# Patient Record
Sex: Female | Born: 1950
Health system: Southern US, Community
[De-identification: ages and names within clinical notes are randomized; demographics above are authoritative.]

## PROBLEM LIST (undated history)

## (undated) DIAGNOSIS — I2602 Saddle embolus of pulmonary artery with acute cor pulmonale: Secondary | ICD-10-CM

## (undated) DIAGNOSIS — M349 Systemic sclerosis, unspecified: Secondary | ICD-10-CM

## (undated) DIAGNOSIS — E039 Hypothyroidism, unspecified: Secondary | ICD-10-CM

## (undated) DIAGNOSIS — N1831 Chronic kidney disease, stage 3a: Secondary | ICD-10-CM

## (undated) DIAGNOSIS — E7800 Pure hypercholesterolemia, unspecified: Secondary | ICD-10-CM

## (undated) DIAGNOSIS — M797 Fibromyalgia: Secondary | ICD-10-CM

## (undated) DIAGNOSIS — M3500 Sicca syndrome, unspecified: Secondary | ICD-10-CM

## (undated) DIAGNOSIS — I10 Essential (primary) hypertension: Secondary | ICD-10-CM

## (undated) DIAGNOSIS — M1990 Unspecified osteoarthritis, unspecified site: Secondary | ICD-10-CM

## (undated) DIAGNOSIS — F3181 Bipolar II disorder: Secondary | ICD-10-CM

## (undated) DIAGNOSIS — R251 Tremor, unspecified: Secondary | ICD-10-CM

## (undated) HISTORY — PX: KNEE SURGERY: SHX244

## (undated) HISTORY — DX: Sjogren syndrome, unspecified: M35.00

## (undated) HISTORY — DX: Hypothyroidism, unspecified: E03.9

## (undated) HISTORY — DX: Essential (primary) hypertension: I10

## (undated) HISTORY — PX: TONSILLECTOMY: SUR1361

## (undated) HISTORY — DX: Saddle embolus of pulmonary artery with acute cor pulmonale: I26.02

## (undated) HISTORY — DX: Tremor, unspecified: R25.1

## (undated) HISTORY — DX: Bipolar II disorder: F31.81

---

## 2012-05-14 ENCOUNTER — Other Ambulatory Visit: Payer: Self-pay | Admitting: Family Medicine

## 2012-05-14 DIAGNOSIS — R944 Abnormal results of kidney function studies: Secondary | ICD-10-CM

## 2012-05-15 ENCOUNTER — Ambulatory Visit
Admission: RE | Admit: 2012-05-15 | Discharge: 2012-05-15 | Disposition: A | Discharge status: Home / Self Care | Payer: Self-pay | Source: Ambulatory Visit | Attending: Family Medicine | Admitting: Family Medicine

## 2012-05-15 DIAGNOSIS — R944 Abnormal results of kidney function studies: Secondary | ICD-10-CM

## 2015-10-18 DIAGNOSIS — F329 Major depressive disorder, single episode, unspecified: Secondary | ICD-10-CM | POA: Diagnosis not present

## 2015-10-18 DIAGNOSIS — M791 Myalgia: Secondary | ICD-10-CM | POA: Diagnosis not present

## 2015-10-18 DIAGNOSIS — Z23 Encounter for immunization: Secondary | ICD-10-CM | POA: Diagnosis not present

## 2015-10-18 DIAGNOSIS — G479 Sleep disorder, unspecified: Secondary | ICD-10-CM | POA: Diagnosis not present

## 2015-10-18 DIAGNOSIS — E039 Hypothyroidism, unspecified: Secondary | ICD-10-CM | POA: Diagnosis not present

## 2015-10-18 DIAGNOSIS — R232 Flushing: Secondary | ICD-10-CM | POA: Diagnosis not present

## 2015-10-18 DIAGNOSIS — E7800 Pure hypercholesterolemia, unspecified: Secondary | ICD-10-CM | POA: Diagnosis not present

## 2015-10-18 DIAGNOSIS — I10 Essential (primary) hypertension: Secondary | ICD-10-CM | POA: Diagnosis not present

## 2016-04-18 DIAGNOSIS — K219 Gastro-esophageal reflux disease without esophagitis: Secondary | ICD-10-CM | POA: Diagnosis not present

## 2016-04-18 DIAGNOSIS — Z6830 Body mass index (BMI) 30.0-30.9, adult: Secondary | ICD-10-CM | POA: Diagnosis not present

## 2016-04-18 DIAGNOSIS — I10 Essential (primary) hypertension: Secondary | ICD-10-CM | POA: Diagnosis not present

## 2016-04-18 DIAGNOSIS — E039 Hypothyroidism, unspecified: Secondary | ICD-10-CM | POA: Diagnosis not present

## 2016-04-18 DIAGNOSIS — N183 Chronic kidney disease, stage 3 (moderate): Secondary | ICD-10-CM | POA: Diagnosis not present

## 2016-04-18 DIAGNOSIS — E7800 Pure hypercholesterolemia, unspecified: Secondary | ICD-10-CM | POA: Diagnosis not present

## 2016-04-18 DIAGNOSIS — G479 Sleep disorder, unspecified: Secondary | ICD-10-CM | POA: Diagnosis not present

## 2016-04-18 DIAGNOSIS — F329 Major depressive disorder, single episode, unspecified: Secondary | ICD-10-CM | POA: Diagnosis not present

## 2016-04-18 DIAGNOSIS — E6609 Other obesity due to excess calories: Secondary | ICD-10-CM | POA: Diagnosis not present

## 2016-04-18 DIAGNOSIS — M791 Myalgia: Secondary | ICD-10-CM | POA: Diagnosis not present

## 2016-08-20 DIAGNOSIS — L309 Dermatitis, unspecified: Secondary | ICD-10-CM | POA: Diagnosis not present

## 2016-10-18 DIAGNOSIS — E7800 Pure hypercholesterolemia, unspecified: Secondary | ICD-10-CM | POA: Diagnosis not present

## 2016-10-18 DIAGNOSIS — E039 Hypothyroidism, unspecified: Secondary | ICD-10-CM | POA: Diagnosis not present

## 2016-10-18 DIAGNOSIS — Z1159 Encounter for screening for other viral diseases: Secondary | ICD-10-CM | POA: Diagnosis not present

## 2016-10-18 DIAGNOSIS — G479 Sleep disorder, unspecified: Secondary | ICD-10-CM | POA: Diagnosis not present

## 2016-10-18 DIAGNOSIS — F329 Major depressive disorder, single episode, unspecified: Secondary | ICD-10-CM | POA: Diagnosis not present

## 2016-10-18 DIAGNOSIS — M791 Myalgia: Secondary | ICD-10-CM | POA: Diagnosis not present

## 2016-10-18 DIAGNOSIS — N183 Chronic kidney disease, stage 3 (moderate): Secondary | ICD-10-CM | POA: Diagnosis not present

## 2016-10-18 DIAGNOSIS — I10 Essential (primary) hypertension: Secondary | ICD-10-CM | POA: Diagnosis not present

## 2017-04-08 DIAGNOSIS — E039 Hypothyroidism, unspecified: Secondary | ICD-10-CM | POA: Diagnosis not present

## 2017-04-08 DIAGNOSIS — I10 Essential (primary) hypertension: Secondary | ICD-10-CM | POA: Diagnosis not present

## 2017-04-08 DIAGNOSIS — G479 Sleep disorder, unspecified: Secondary | ICD-10-CM | POA: Diagnosis not present

## 2017-04-08 DIAGNOSIS — E6609 Other obesity due to excess calories: Secondary | ICD-10-CM | POA: Diagnosis not present

## 2017-04-08 DIAGNOSIS — M791 Myalgia: Secondary | ICD-10-CM | POA: Diagnosis not present

## 2017-04-08 DIAGNOSIS — E7800 Pure hypercholesterolemia, unspecified: Secondary | ICD-10-CM | POA: Diagnosis not present

## 2017-04-08 DIAGNOSIS — N183 Chronic kidney disease, stage 3 (moderate): Secondary | ICD-10-CM | POA: Diagnosis not present

## 2017-04-08 DIAGNOSIS — F329 Major depressive disorder, single episode, unspecified: Secondary | ICD-10-CM | POA: Diagnosis not present

## 2017-11-25 DIAGNOSIS — E7800 Pure hypercholesterolemia, unspecified: Secondary | ICD-10-CM | POA: Diagnosis not present

## 2017-11-25 DIAGNOSIS — R234 Changes in skin texture: Secondary | ICD-10-CM | POA: Diagnosis not present

## 2017-11-25 DIAGNOSIS — E6609 Other obesity due to excess calories: Secondary | ICD-10-CM | POA: Diagnosis not present

## 2017-11-25 DIAGNOSIS — E039 Hypothyroidism, unspecified: Secondary | ICD-10-CM | POA: Diagnosis not present

## 2017-11-25 DIAGNOSIS — H6121 Impacted cerumen, right ear: Secondary | ICD-10-CM | POA: Diagnosis not present

## 2017-11-25 DIAGNOSIS — Z8349 Family history of other endocrine, nutritional and metabolic diseases: Secondary | ICD-10-CM | POA: Diagnosis not present

## 2017-11-25 DIAGNOSIS — M791 Myalgia, unspecified site: Secondary | ICD-10-CM | POA: Diagnosis not present

## 2017-11-25 DIAGNOSIS — I10 Essential (primary) hypertension: Secondary | ICD-10-CM | POA: Diagnosis not present

## 2017-11-25 DIAGNOSIS — F329 Major depressive disorder, single episode, unspecified: Secondary | ICD-10-CM | POA: Diagnosis not present

## 2017-11-25 DIAGNOSIS — G479 Sleep disorder, unspecified: Secondary | ICD-10-CM | POA: Diagnosis not present

## 2018-06-02 DIAGNOSIS — F3340 Major depressive disorder, recurrent, in remission, unspecified: Secondary | ICD-10-CM | POA: Diagnosis not present

## 2018-06-02 DIAGNOSIS — E7800 Pure hypercholesterolemia, unspecified: Secondary | ICD-10-CM | POA: Diagnosis not present

## 2018-06-02 DIAGNOSIS — I129 Hypertensive chronic kidney disease with stage 1 through stage 4 chronic kidney disease, or unspecified chronic kidney disease: Secondary | ICD-10-CM | POA: Diagnosis not present

## 2018-06-02 DIAGNOSIS — E039 Hypothyroidism, unspecified: Secondary | ICD-10-CM | POA: Diagnosis not present

## 2018-06-02 DIAGNOSIS — Z23 Encounter for immunization: Secondary | ICD-10-CM | POA: Diagnosis not present

## 2018-06-02 DIAGNOSIS — N183 Chronic kidney disease, stage 3 (moderate): Secondary | ICD-10-CM | POA: Diagnosis not present

## 2018-08-26 DIAGNOSIS — H16223 Keratoconjunctivitis sicca, not specified as Sjogren's, bilateral: Secondary | ICD-10-CM | POA: Diagnosis not present

## 2018-09-23 DIAGNOSIS — L940 Localized scleroderma [morphea]: Secondary | ICD-10-CM | POA: Diagnosis not present

## 2018-10-13 DIAGNOSIS — L940 Localized scleroderma [morphea]: Secondary | ICD-10-CM | POA: Diagnosis not present

## 2018-10-23 DIAGNOSIS — M3501 Sicca syndrome with keratoconjunctivitis: Secondary | ICD-10-CM | POA: Diagnosis not present

## 2018-10-27 DIAGNOSIS — Z4802 Encounter for removal of sutures: Secondary | ICD-10-CM | POA: Diagnosis not present

## 2018-12-08 DIAGNOSIS — E039 Hypothyroidism, unspecified: Secondary | ICD-10-CM | POA: Diagnosis not present

## 2018-12-08 DIAGNOSIS — Z6829 Body mass index (BMI) 29.0-29.9, adult: Secondary | ICD-10-CM | POA: Diagnosis not present

## 2018-12-08 DIAGNOSIS — M791 Myalgia, unspecified site: Secondary | ICD-10-CM | POA: Diagnosis not present

## 2018-12-08 DIAGNOSIS — G479 Sleep disorder, unspecified: Secondary | ICD-10-CM | POA: Diagnosis not present

## 2018-12-08 DIAGNOSIS — I129 Hypertensive chronic kidney disease with stage 1 through stage 4 chronic kidney disease, or unspecified chronic kidney disease: Secondary | ICD-10-CM | POA: Diagnosis not present

## 2018-12-08 DIAGNOSIS — F331 Major depressive disorder, recurrent, moderate: Secondary | ICD-10-CM | POA: Diagnosis not present

## 2018-12-08 DIAGNOSIS — E7800 Pure hypercholesterolemia, unspecified: Secondary | ICD-10-CM | POA: Diagnosis not present

## 2018-12-08 DIAGNOSIS — L940 Localized scleroderma [morphea]: Secondary | ICD-10-CM | POA: Diagnosis not present

## 2018-12-18 DIAGNOSIS — H16223 Keratoconjunctivitis sicca, not specified as Sjogren's, bilateral: Secondary | ICD-10-CM | POA: Diagnosis not present

## 2018-12-25 DIAGNOSIS — M3501 Sicca syndrome with keratoconjunctivitis: Secondary | ICD-10-CM | POA: Diagnosis not present

## 2019-03-11 DIAGNOSIS — E039 Hypothyroidism, unspecified: Secondary | ICD-10-CM | POA: Diagnosis not present

## 2019-06-12 DIAGNOSIS — E039 Hypothyroidism, unspecified: Secondary | ICD-10-CM | POA: Diagnosis not present

## 2019-06-24 DIAGNOSIS — E039 Hypothyroidism, unspecified: Secondary | ICD-10-CM | POA: Diagnosis not present

## 2019-06-24 DIAGNOSIS — F3340 Major depressive disorder, recurrent, in remission, unspecified: Secondary | ICD-10-CM | POA: Diagnosis not present

## 2019-06-24 DIAGNOSIS — M791 Myalgia, unspecified site: Secondary | ICD-10-CM | POA: Diagnosis not present

## 2019-06-24 DIAGNOSIS — I129 Hypertensive chronic kidney disease with stage 1 through stage 4 chronic kidney disease, or unspecified chronic kidney disease: Secondary | ICD-10-CM | POA: Diagnosis not present

## 2019-06-24 DIAGNOSIS — N183 Chronic kidney disease, stage 3 (moderate): Secondary | ICD-10-CM | POA: Diagnosis not present

## 2019-06-24 DIAGNOSIS — R5383 Other fatigue: Secondary | ICD-10-CM | POA: Diagnosis not present

## 2019-08-05 DIAGNOSIS — N183 Chronic kidney disease, stage 3 (moderate): Secondary | ICD-10-CM | POA: Diagnosis not present

## 2019-08-05 DIAGNOSIS — F331 Major depressive disorder, recurrent, moderate: Secondary | ICD-10-CM | POA: Diagnosis not present

## 2019-08-05 DIAGNOSIS — E7800 Pure hypercholesterolemia, unspecified: Secondary | ICD-10-CM | POA: Diagnosis not present

## 2019-08-05 DIAGNOSIS — I129 Hypertensive chronic kidney disease with stage 1 through stage 4 chronic kidney disease, or unspecified chronic kidney disease: Secondary | ICD-10-CM | POA: Diagnosis not present

## 2019-08-05 DIAGNOSIS — E039 Hypothyroidism, unspecified: Secondary | ICD-10-CM | POA: Diagnosis not present

## 2019-08-05 DIAGNOSIS — F3340 Major depressive disorder, recurrent, in remission, unspecified: Secondary | ICD-10-CM | POA: Diagnosis not present

## 2019-09-07 DIAGNOSIS — H16223 Keratoconjunctivitis sicca, not specified as Sjogren's, bilateral: Secondary | ICD-10-CM | POA: Diagnosis not present

## 2019-11-09 DIAGNOSIS — I129 Hypertensive chronic kidney disease with stage 1 through stage 4 chronic kidney disease, or unspecified chronic kidney disease: Secondary | ICD-10-CM | POA: Diagnosis not present

## 2019-11-09 DIAGNOSIS — E039 Hypothyroidism, unspecified: Secondary | ICD-10-CM | POA: Diagnosis not present

## 2019-11-09 DIAGNOSIS — E7800 Pure hypercholesterolemia, unspecified: Secondary | ICD-10-CM | POA: Diagnosis not present

## 2019-11-09 DIAGNOSIS — F331 Major depressive disorder, recurrent, moderate: Secondary | ICD-10-CM | POA: Diagnosis not present

## 2019-11-09 DIAGNOSIS — F3340 Major depressive disorder, recurrent, in remission, unspecified: Secondary | ICD-10-CM | POA: Diagnosis not present

## 2020-01-19 DIAGNOSIS — Z23 Encounter for immunization: Secondary | ICD-10-CM | POA: Diagnosis not present

## 2020-02-16 DIAGNOSIS — Z23 Encounter for immunization: Secondary | ICD-10-CM | POA: Diagnosis not present

## 2020-02-22 DIAGNOSIS — E039 Hypothyroidism, unspecified: Secondary | ICD-10-CM | POA: Diagnosis not present

## 2020-02-22 DIAGNOSIS — I129 Hypertensive chronic kidney disease with stage 1 through stage 4 chronic kidney disease, or unspecified chronic kidney disease: Secondary | ICD-10-CM | POA: Diagnosis not present

## 2020-02-22 DIAGNOSIS — E7800 Pure hypercholesterolemia, unspecified: Secondary | ICD-10-CM | POA: Diagnosis not present

## 2020-02-22 DIAGNOSIS — R42 Dizziness and giddiness: Secondary | ICD-10-CM | POA: Diagnosis not present

## 2020-02-22 DIAGNOSIS — N183 Chronic kidney disease, stage 3 unspecified: Secondary | ICD-10-CM | POA: Diagnosis not present

## 2020-02-26 DIAGNOSIS — I129 Hypertensive chronic kidney disease with stage 1 through stage 4 chronic kidney disease, or unspecified chronic kidney disease: Secondary | ICD-10-CM | POA: Diagnosis not present

## 2020-02-26 DIAGNOSIS — N183 Chronic kidney disease, stage 3 unspecified: Secondary | ICD-10-CM | POA: Diagnosis not present

## 2020-02-26 DIAGNOSIS — E039 Hypothyroidism, unspecified: Secondary | ICD-10-CM | POA: Diagnosis not present

## 2020-02-26 DIAGNOSIS — E7800 Pure hypercholesterolemia, unspecified: Secondary | ICD-10-CM | POA: Diagnosis not present

## 2020-02-26 DIAGNOSIS — R42 Dizziness and giddiness: Secondary | ICD-10-CM | POA: Diagnosis not present

## 2020-03-24 DIAGNOSIS — F3340 Major depressive disorder, recurrent, in remission, unspecified: Secondary | ICD-10-CM | POA: Diagnosis not present

## 2020-03-24 DIAGNOSIS — E7800 Pure hypercholesterolemia, unspecified: Secondary | ICD-10-CM | POA: Diagnosis not present

## 2020-03-24 DIAGNOSIS — I129 Hypertensive chronic kidney disease with stage 1 through stage 4 chronic kidney disease, or unspecified chronic kidney disease: Secondary | ICD-10-CM | POA: Diagnosis not present

## 2020-03-24 DIAGNOSIS — F331 Major depressive disorder, recurrent, moderate: Secondary | ICD-10-CM | POA: Diagnosis not present

## 2020-03-24 DIAGNOSIS — N183 Chronic kidney disease, stage 3 unspecified: Secondary | ICD-10-CM | POA: Diagnosis not present

## 2020-03-24 DIAGNOSIS — E039 Hypothyroidism, unspecified: Secondary | ICD-10-CM | POA: Diagnosis not present

## 2020-04-08 DIAGNOSIS — N183 Chronic kidney disease, stage 3 unspecified: Secondary | ICD-10-CM | POA: Diagnosis not present

## 2020-04-08 DIAGNOSIS — I10 Essential (primary) hypertension: Secondary | ICD-10-CM | POA: Diagnosis not present

## 2020-04-08 DIAGNOSIS — F331 Major depressive disorder, recurrent, moderate: Secondary | ICD-10-CM | POA: Diagnosis not present

## 2020-04-08 DIAGNOSIS — E039 Hypothyroidism, unspecified: Secondary | ICD-10-CM | POA: Diagnosis not present

## 2020-04-08 DIAGNOSIS — F3181 Bipolar II disorder: Secondary | ICD-10-CM | POA: Diagnosis not present

## 2020-04-08 DIAGNOSIS — E7800 Pure hypercholesterolemia, unspecified: Secondary | ICD-10-CM | POA: Diagnosis not present

## 2020-04-08 DIAGNOSIS — I129 Hypertensive chronic kidney disease with stage 1 through stage 4 chronic kidney disease, or unspecified chronic kidney disease: Secondary | ICD-10-CM | POA: Diagnosis not present

## 2020-04-08 DIAGNOSIS — F3340 Major depressive disorder, recurrent, in remission, unspecified: Secondary | ICD-10-CM | POA: Diagnosis not present

## 2020-04-13 DIAGNOSIS — E039 Hypothyroidism, unspecified: Secondary | ICD-10-CM | POA: Diagnosis not present

## 2020-04-13 DIAGNOSIS — F3181 Bipolar II disorder: Secondary | ICD-10-CM | POA: Diagnosis not present

## 2020-04-13 DIAGNOSIS — I10 Essential (primary) hypertension: Secondary | ICD-10-CM | POA: Diagnosis not present

## 2020-05-23 DIAGNOSIS — R1110 Vomiting, unspecified: Secondary | ICD-10-CM | POA: Diagnosis not present

## 2020-05-23 DIAGNOSIS — I129 Hypertensive chronic kidney disease with stage 1 through stage 4 chronic kidney disease, or unspecified chronic kidney disease: Secondary | ICD-10-CM | POA: Diagnosis not present

## 2020-05-23 DIAGNOSIS — Z6827 Body mass index (BMI) 27.0-27.9, adult: Secondary | ICD-10-CM | POA: Diagnosis not present

## 2020-06-16 DIAGNOSIS — Z6827 Body mass index (BMI) 27.0-27.9, adult: Secondary | ICD-10-CM | POA: Diagnosis not present

## 2020-06-16 DIAGNOSIS — R1110 Vomiting, unspecified: Secondary | ICD-10-CM | POA: Diagnosis not present

## 2020-06-16 DIAGNOSIS — I129 Hypertensive chronic kidney disease with stage 1 through stage 4 chronic kidney disease, or unspecified chronic kidney disease: Secondary | ICD-10-CM | POA: Diagnosis not present

## 2020-06-16 DIAGNOSIS — R8290 Unspecified abnormal findings in urine: Secondary | ICD-10-CM | POA: Diagnosis not present

## 2020-06-22 ENCOUNTER — Other Ambulatory Visit: Payer: Self-pay | Admitting: Family Medicine

## 2020-06-22 DIAGNOSIS — N399 Disorder of urinary system, unspecified: Secondary | ICD-10-CM

## 2020-06-27 ENCOUNTER — Ambulatory Visit
Admission: RE | Admit: 2020-06-27 | Discharge: 2020-06-27 | Disposition: A | Discharge status: Home / Self Care | Payer: Medicare Other | Source: Ambulatory Visit | Attending: Family Medicine | Admitting: Family Medicine

## 2020-06-27 DIAGNOSIS — N390 Urinary tract infection, site not specified: Secondary | ICD-10-CM | POA: Diagnosis not present

## 2020-06-27 DIAGNOSIS — N399 Disorder of urinary system, unspecified: Secondary | ICD-10-CM

## 2020-09-19 DIAGNOSIS — M1612 Unilateral primary osteoarthritis, left hip: Secondary | ICD-10-CM | POA: Diagnosis not present

## 2020-09-19 DIAGNOSIS — M25562 Pain in left knee: Secondary | ICD-10-CM | POA: Diagnosis not present

## 2020-09-19 DIAGNOSIS — M25552 Pain in left hip: Secondary | ICD-10-CM | POA: Diagnosis not present

## 2020-10-17 DIAGNOSIS — M797 Fibromyalgia: Secondary | ICD-10-CM | POA: Diagnosis not present

## 2020-10-17 DIAGNOSIS — N183 Chronic kidney disease, stage 3 unspecified: Secondary | ICD-10-CM | POA: Diagnosis not present

## 2020-10-17 DIAGNOSIS — I129 Hypertensive chronic kidney disease with stage 1 through stage 4 chronic kidney disease, or unspecified chronic kidney disease: Secondary | ICD-10-CM | POA: Diagnosis not present

## 2020-10-17 DIAGNOSIS — E7800 Pure hypercholesterolemia, unspecified: Secondary | ICD-10-CM | POA: Diagnosis not present

## 2020-10-17 DIAGNOSIS — R42 Dizziness and giddiness: Secondary | ICD-10-CM | POA: Diagnosis not present

## 2020-10-17 DIAGNOSIS — E039 Hypothyroidism, unspecified: Secondary | ICD-10-CM | POA: Diagnosis not present

## 2020-10-17 DIAGNOSIS — Z6828 Body mass index (BMI) 28.0-28.9, adult: Secondary | ICD-10-CM | POA: Diagnosis not present

## 2020-10-21 DIAGNOSIS — M1612 Unilateral primary osteoarthritis, left hip: Secondary | ICD-10-CM | POA: Diagnosis not present

## 2020-10-21 DIAGNOSIS — M1712 Unilateral primary osteoarthritis, left knee: Secondary | ICD-10-CM | POA: Diagnosis not present

## 2021-03-09 ENCOUNTER — Inpatient Hospital Stay (HOSPITAL_COMMUNITY)
Admission: EM | Admit: 2021-03-09 | Discharge: 2021-03-13 | DRG: 175 | Disposition: A | Discharge status: Home / Self Care | Payer: Medicare Other | Attending: Internal Medicine | Admitting: Internal Medicine

## 2021-03-09 ENCOUNTER — Other Ambulatory Visit: Payer: Self-pay

## 2021-03-09 ENCOUNTER — Emergency Department (HOSPITAL_COMMUNITY): Payer: Medicare Other

## 2021-03-09 ENCOUNTER — Encounter (HOSPITAL_COMMUNITY): Payer: Self-pay

## 2021-03-09 DIAGNOSIS — Z79899 Other long term (current) drug therapy: Secondary | ICD-10-CM

## 2021-03-09 DIAGNOSIS — I10 Essential (primary) hypertension: Secondary | ICD-10-CM | POA: Diagnosis not present

## 2021-03-09 DIAGNOSIS — I2692 Saddle embolus of pulmonary artery without acute cor pulmonale: Secondary | ICD-10-CM

## 2021-03-09 DIAGNOSIS — I2699 Other pulmonary embolism without acute cor pulmonale: Secondary | ICD-10-CM | POA: Diagnosis not present

## 2021-03-09 DIAGNOSIS — N1831 Chronic kidney disease, stage 3a: Secondary | ICD-10-CM | POA: Diagnosis present

## 2021-03-09 DIAGNOSIS — Z7901 Long term (current) use of anticoagulants: Secondary | ICD-10-CM

## 2021-03-09 DIAGNOSIS — N179 Acute kidney failure, unspecified: Secondary | ICD-10-CM | POA: Diagnosis not present

## 2021-03-09 DIAGNOSIS — E7800 Pure hypercholesterolemia, unspecified: Secondary | ICD-10-CM | POA: Diagnosis present

## 2021-03-09 DIAGNOSIS — I7300 Raynaud's syndrome without gangrene: Secondary | ICD-10-CM | POA: Diagnosis present

## 2021-03-09 DIAGNOSIS — R1012 Left upper quadrant pain: Secondary | ICD-10-CM | POA: Diagnosis not present

## 2021-03-09 DIAGNOSIS — J90 Pleural effusion, not elsewhere classified: Secondary | ICD-10-CM | POA: Diagnosis not present

## 2021-03-09 DIAGNOSIS — E785 Hyperlipidemia, unspecified: Secondary | ICD-10-CM | POA: Diagnosis present

## 2021-03-09 DIAGNOSIS — I2602 Saddle embolus of pulmonary artery with acute cor pulmonale: Secondary | ICD-10-CM | POA: Diagnosis not present

## 2021-03-09 DIAGNOSIS — R0902 Hypoxemia: Secondary | ICD-10-CM | POA: Diagnosis not present

## 2021-03-09 DIAGNOSIS — M549 Dorsalgia, unspecified: Secondary | ICD-10-CM | POA: Diagnosis not present

## 2021-03-09 DIAGNOSIS — R928 Other abnormal and inconclusive findings on diagnostic imaging of breast: Secondary | ICD-10-CM | POA: Diagnosis present

## 2021-03-09 DIAGNOSIS — Z66 Do not resuscitate: Secondary | ICD-10-CM | POA: Diagnosis not present

## 2021-03-09 DIAGNOSIS — E039 Hypothyroidism, unspecified: Secondary | ICD-10-CM | POA: Diagnosis present

## 2021-03-09 DIAGNOSIS — Z20822 Contact with and (suspected) exposure to covid-19: Secondary | ICD-10-CM | POA: Diagnosis not present

## 2021-03-09 DIAGNOSIS — R079 Chest pain, unspecified: Secondary | ICD-10-CM | POA: Diagnosis not present

## 2021-03-09 DIAGNOSIS — Z79890 Hormone replacement therapy (postmenopausal): Secondary | ICD-10-CM

## 2021-03-09 DIAGNOSIS — F39 Unspecified mood [affective] disorder: Secondary | ICD-10-CM | POA: Diagnosis present

## 2021-03-09 DIAGNOSIS — K651 Peritoneal abscess: Secondary | ICD-10-CM | POA: Diagnosis not present

## 2021-03-09 DIAGNOSIS — I129 Hypertensive chronic kidney disease with stage 1 through stage 4 chronic kidney disease, or unspecified chronic kidney disease: Secondary | ICD-10-CM | POA: Diagnosis present

## 2021-03-09 DIAGNOSIS — I82412 Acute embolism and thrombosis of left femoral vein: Secondary | ICD-10-CM | POA: Diagnosis not present

## 2021-03-09 DIAGNOSIS — M349 Systemic sclerosis, unspecified: Secondary | ICD-10-CM | POA: Diagnosis present

## 2021-03-09 DIAGNOSIS — N63 Unspecified lump in unspecified breast: Secondary | ICD-10-CM | POA: Diagnosis present

## 2021-03-09 DIAGNOSIS — M797 Fibromyalgia: Secondary | ICD-10-CM | POA: Diagnosis present

## 2021-03-09 DIAGNOSIS — R1084 Generalized abdominal pain: Secondary | ICD-10-CM | POA: Diagnosis not present

## 2021-03-09 HISTORY — DX: Fibromyalgia: M79.7

## 2021-03-09 HISTORY — DX: Chronic kidney disease, stage 3a: N18.31

## 2021-03-09 HISTORY — DX: Systemic sclerosis, unspecified: M34.9

## 2021-03-09 HISTORY — DX: Pure hypercholesterolemia, unspecified: E78.00

## 2021-03-09 HISTORY — DX: Unspecified osteoarthritis, unspecified site: M19.90

## 2021-03-09 LAB — CBC WITH DIFFERENTIAL/PLATELET
Abs Immature Granulocytes: 0.07 10*3/uL (ref 0.00–0.07)
Basophils Absolute: 0 10*3/uL (ref 0.0–0.1)
Basophils Relative: 0 %
Eosinophils Absolute: 0.1 10*3/uL (ref 0.0–0.5)
Eosinophils Relative: 1 %
HCT: 42.4 % (ref 36.0–46.0)
Hemoglobin: 14 g/dL (ref 12.0–15.0)
Immature Granulocytes: 1 %
Lymphocytes Relative: 12 %
Lymphs Abs: 1.5 10*3/uL (ref 0.7–4.0)
MCH: 31.3 pg (ref 26.0–34.0)
MCHC: 33 g/dL (ref 30.0–36.0)
MCV: 94.6 fL (ref 80.0–100.0)
Monocytes Absolute: 1 10*3/uL (ref 0.1–1.0)
Monocytes Relative: 8 %
Neutro Abs: 10.1 10*3/uL — ABNORMAL HIGH (ref 1.7–7.7)
Neutrophils Relative %: 78 %
Platelets: 196 10*3/uL (ref 150–400)
RBC: 4.48 MIL/uL (ref 3.87–5.11)
RDW: 12.3 % (ref 11.5–15.5)
WBC: 12.8 10*3/uL — ABNORMAL HIGH (ref 4.0–10.5)
nRBC: 0 % (ref 0.0–0.2)

## 2021-03-09 LAB — COMPREHENSIVE METABOLIC PANEL
ALT: 12 U/L (ref 0–44)
AST: 18 U/L (ref 15–41)
Albumin: 3.5 g/dL (ref 3.5–5.0)
Alkaline Phosphatase: 94 U/L (ref 38–126)
Anion gap: 7 (ref 5–15)
BUN: 12 mg/dL (ref 8–23)
CO2: 27 mmol/L (ref 22–32)
Calcium: 8.9 mg/dL (ref 8.9–10.3)
Chloride: 102 mmol/L (ref 98–111)
Creatinine, Ser: 1.2 mg/dL — ABNORMAL HIGH (ref 0.44–1.00)
GFR, Estimated: 49 mL/min — ABNORMAL LOW (ref >60)
Glucose, Bld: 119 mg/dL — ABNORMAL HIGH (ref 70–99)
Potassium: 4 mmol/L (ref 3.5–5.1)
Sodium: 136 mmol/L (ref 135–145)
Total Bilirubin: 2.5 mg/dL — ABNORMAL HIGH (ref 0.3–1.2)
Total Protein: 6.4 g/dL — ABNORMAL LOW (ref 6.5–8.1)

## 2021-03-09 LAB — URINALYSIS, ROUTINE W REFLEX MICROSCOPIC
Bilirubin Urine: NEGATIVE
Glucose, UA: NEGATIVE mg/dL
Ketones, ur: NEGATIVE mg/dL
Nitrite: NEGATIVE
Protein, ur: NEGATIVE mg/dL
Specific Gravity, Urine: 1.016 (ref 1.005–1.030)
pH: 6 (ref 5.0–8.0)

## 2021-03-09 LAB — LIPASE, BLOOD: Lipase: 27 U/L (ref 11–51)

## 2021-03-09 LAB — TROPONIN I (HIGH SENSITIVITY): Troponin I (High Sensitivity): 6 ng/L (ref <18)

## 2021-03-09 MED ORDER — ACETAMINOPHEN 500 MG PO TABS
1000.0000 mg | ORAL_TABLET | Freq: Once | ORAL | Status: AC
  Administered 2021-03-10: 1000 mg via ORAL
  Filled 2021-03-09: qty 2

## 2021-03-09 NOTE — ED Triage Notes (Signed)
Brought in by [facility] - left upper quadrant pain x 2 days. Denies any nausea, vomiting and diarrhea.   12lead unremarkable. G.20 left wrist.

## 2021-03-09 NOTE — ED Provider Notes (Signed)
MSE was initiated and I personally evaluated the patient and placed orders (if any) at  10:41 PM on Mar 09, 2021.  Patient here with left sided rib pain x 2 days.  She denies any nausea, vomiting, or diarrhea.  She denies cough.  Denies dysuria.    A&Ox4 No focal abdominal tenderness No respiratory distress  Vitals:   03/09/21 2242  BP: (!) 125/91  Pulse: 78  Resp: 17  Temp: (!) 100.5 F (38.1 C)  SpO2: 96%     Discussed with patient that their care has been initiated.   They are counseled that they will need to remain in the ED until the completion of their workup, including full H&P and results of any tests.  Risks of leaving the emergency department prior to completion of treatment were discussed. Patient was advised to inform ED staff if they are leaving before their treatment is complete. The patient acknowledged these risks and time was allowed for questions.    The patient appears stable so that the remainder of the MSE may be completed by another provider.    Roxy Horseman, PA-C 03/09/21 2244    Tegeler, Canary Brim, MD 03/09/21 [phone]

## 2021-03-10 ENCOUNTER — Encounter (HOSPITAL_COMMUNITY): Payer: Self-pay | Admitting: Internal Medicine

## 2021-03-10 ENCOUNTER — Emergency Department (HOSPITAL_COMMUNITY): Payer: Medicare Other

## 2021-03-10 ENCOUNTER — Inpatient Hospital Stay (HOSPITAL_COMMUNITY): Payer: Medicare Other

## 2021-03-10 DIAGNOSIS — Z66 Do not resuscitate: Secondary | ICD-10-CM | POA: Diagnosis present

## 2021-03-10 DIAGNOSIS — Z79899 Other long term (current) drug therapy: Secondary | ICD-10-CM | POA: Diagnosis not present

## 2021-03-10 DIAGNOSIS — N1831 Chronic kidney disease, stage 3a: Secondary | ICD-10-CM

## 2021-03-10 DIAGNOSIS — K651 Peritoneal abscess: Secondary | ICD-10-CM | POA: Diagnosis not present

## 2021-03-10 DIAGNOSIS — J90 Pleural effusion, not elsewhere classified: Secondary | ICD-10-CM | POA: Diagnosis not present

## 2021-03-10 DIAGNOSIS — I7300 Raynaud's syndrome without gangrene: Secondary | ICD-10-CM | POA: Diagnosis present

## 2021-03-10 DIAGNOSIS — R079 Chest pain, unspecified: Secondary | ICD-10-CM | POA: Diagnosis not present

## 2021-03-10 DIAGNOSIS — E785 Hyperlipidemia, unspecified: Secondary | ICD-10-CM | POA: Diagnosis present

## 2021-03-10 DIAGNOSIS — I2602 Saddle embolus of pulmonary artery with acute cor pulmonale: Principal | ICD-10-CM | POA: Diagnosis present

## 2021-03-10 DIAGNOSIS — N63 Unspecified lump in unspecified breast: Secondary | ICD-10-CM

## 2021-03-10 DIAGNOSIS — I2609 Other pulmonary embolism with acute cor pulmonale: Secondary | ICD-10-CM | POA: Diagnosis not present

## 2021-03-10 DIAGNOSIS — F39 Unspecified mood [affective] disorder: Secondary | ICD-10-CM | POA: Diagnosis present

## 2021-03-10 DIAGNOSIS — I10 Essential (primary) hypertension: Secondary | ICD-10-CM | POA: Diagnosis not present

## 2021-03-10 DIAGNOSIS — M349 Systemic sclerosis, unspecified: Secondary | ICD-10-CM | POA: Diagnosis present

## 2021-03-10 DIAGNOSIS — I82412 Acute embolism and thrombosis of left femoral vein: Secondary | ICD-10-CM | POA: Diagnosis present

## 2021-03-10 DIAGNOSIS — M797 Fibromyalgia: Secondary | ICD-10-CM | POA: Diagnosis present

## 2021-03-10 DIAGNOSIS — E039 Hypothyroidism, unspecified: Secondary | ICD-10-CM

## 2021-03-10 DIAGNOSIS — E7800 Pure hypercholesterolemia, unspecified: Secondary | ICD-10-CM | POA: Diagnosis present

## 2021-03-10 DIAGNOSIS — Z79890 Hormone replacement therapy (postmenopausal): Secondary | ICD-10-CM | POA: Diagnosis not present

## 2021-03-10 DIAGNOSIS — N179 Acute kidney failure, unspecified: Secondary | ICD-10-CM | POA: Diagnosis present

## 2021-03-10 DIAGNOSIS — Z20822 Contact with and (suspected) exposure to covid-19: Secondary | ICD-10-CM | POA: Diagnosis present

## 2021-03-10 DIAGNOSIS — I2699 Other pulmonary embolism without acute cor pulmonale: Secondary | ICD-10-CM | POA: Diagnosis not present

## 2021-03-10 DIAGNOSIS — R928 Other abnormal and inconclusive findings on diagnostic imaging of breast: Secondary | ICD-10-CM | POA: Diagnosis present

## 2021-03-10 DIAGNOSIS — Z7901 Long term (current) use of anticoagulants: Secondary | ICD-10-CM | POA: Diagnosis not present

## 2021-03-10 DIAGNOSIS — I129 Hypertensive chronic kidney disease with stage 1 through stage 4 chronic kidney disease, or unspecified chronic kidney disease: Secondary | ICD-10-CM | POA: Diagnosis present

## 2021-03-10 LAB — HEPARIN LEVEL (UNFRACTIONATED): Heparin Unfractionated: >1.1 IU/mL — ABNORMAL HIGH (ref 0.30–0.70)

## 2021-03-10 LAB — ECHOCARDIOGRAM LIMITED
Area-P 1/2: 2.96 cm2
Height: 64 in
S' Lateral: 2.4 cm
Weight: 2480 oz

## 2021-03-10 LAB — RESP PANEL BY RT-PCR (FLU A&B, COVID) ARPGX2
Influenza A by PCR: NEGATIVE
Influenza B by PCR: NEGATIVE
SARS Coronavirus 2 by RT PCR: NEGATIVE

## 2021-03-10 LAB — HIV ANTIBODY (ROUTINE TESTING W REFLEX): HIV Screen 4th Generation wRfx: NONREACTIVE

## 2021-03-10 LAB — TROPONIN I (HIGH SENSITIVITY): Troponin I (High Sensitivity): 5 ng/L (ref <18)

## 2021-03-10 MED ORDER — ARIPIPRAZOLE 5 MG PO TABS
5.0000 mg | ORAL_TABLET | Freq: Every day | ORAL | Status: DC
  Administered 2021-03-10 – 2021-03-12 (×3): 5 mg via ORAL
  Filled 2021-03-10 (×3): qty 1

## 2021-03-10 MED ORDER — LEVOTHYROXINE SODIUM 88 MCG PO TABS
88.0000 ug | ORAL_TABLET | Freq: Every morning | ORAL | Status: DC
  Administered 2021-03-11 – 2021-03-13 (×3): 88 ug via ORAL
  Filled 2021-03-10 (×3): qty 1

## 2021-03-10 MED ORDER — TRAMADOL HCL 50 MG PO TABS
100.0000 mg | ORAL_TABLET | Freq: Two times a day (BID) | ORAL | Status: DC
  Administered 2021-03-10 – 2021-03-13 (×7): 100 mg via ORAL
  Filled 2021-03-10 (×7): qty 2

## 2021-03-10 MED ORDER — PANTOPRAZOLE SODIUM 40 MG PO TBEC
40.0000 mg | DELAYED_RELEASE_TABLET | Freq: Every day | ORAL | Status: DC
  Administered 2021-03-10 – 2021-03-13 (×4): 40 mg via ORAL
  Filled 2021-03-10 (×4): qty 1

## 2021-03-10 MED ORDER — DOCUSATE SODIUM 100 MG PO CAPS
100.0000 mg | ORAL_CAPSULE | Freq: Two times a day (BID) | ORAL | Status: DC
  Administered 2021-03-10 – 2021-03-13 (×5): 100 mg via ORAL
  Filled 2021-03-10 (×6): qty 1

## 2021-03-10 MED ORDER — FENTANYL CITRATE (PF) 100 MCG/2ML IJ SOLN: 50.0000 ug | INTRAMUSCULAR | Status: DC | PRN

## 2021-03-10 MED ORDER — ATORVASTATIN CALCIUM 10 MG PO TABS
20.0000 mg | ORAL_TABLET | Freq: Every day | ORAL | Status: DC
  Administered 2021-03-10 – 2021-03-12 (×3): 20 mg via ORAL
  Filled 2021-03-10 (×3): qty 2

## 2021-03-10 MED ORDER — ONDANSETRON HCL 4 MG PO TABS
4.0000 mg | ORAL_TABLET | Freq: Four times a day (QID) | ORAL | Status: DC | PRN
  Filled 2021-03-10: qty 1

## 2021-03-10 MED ORDER — ATENOLOL 25 MG PO TABS
50.0000 mg | ORAL_TABLET | Freq: Two times a day (BID) | ORAL | Status: DC
  Administered 2021-03-10 – 2021-03-13 (×6): 50 mg via ORAL
  Filled 2021-03-10 (×6): qty 2

## 2021-03-10 MED ORDER — HEPARIN BOLUS VIA INFUSION
4500.0000 [IU] | Freq: Once | INTRAVENOUS | Status: AC
  Administered 2021-03-10: 4500 [IU] via INTRAVENOUS
  Filled 2021-03-10: qty 4500

## 2021-03-10 MED ORDER — ADULT MULTIVITAMIN W/MINERALS CH
1.0000 | ORAL_TABLET | Freq: Every day | ORAL | Status: DC
  Administered 2021-03-11 – 2021-03-13 (×3): 1 via ORAL
  Filled 2021-03-10 (×3): qty 1

## 2021-03-10 MED ORDER — POLYETHYLENE GLYCOL 3350 17 G PO PACK: 17.0000 g | PACK | Freq: Every day | ORAL | Status: DC | PRN

## 2021-03-10 MED ORDER — ONDANSETRON HCL 4 MG/2ML IJ SOLN
4.0000 mg | Freq: Four times a day (QID) | INTRAMUSCULAR | Status: DC | PRN
  Filled 2021-03-10: qty 2

## 2021-03-10 MED ORDER — VORTIOXETINE HBR 5 MG PO TABS
5.0000 mg | ORAL_TABLET | Freq: Every day | ORAL | Status: DC
  Administered 2021-03-10 – 2021-03-13 (×4): 5 mg via ORAL
  Filled 2021-03-10 (×4): qty 1

## 2021-03-10 MED ORDER — PROSOURCE PLUS PO LIQD
30.0000 mL | Freq: Every day | ORAL | Status: DC
  Filled 2021-03-10 (×3): qty 30

## 2021-03-10 MED ORDER — BISACODYL 5 MG PO TBEC: 5.0000 mg | DELAYED_RELEASE_TABLET | Freq: Every day | ORAL | Status: DC | PRN

## 2021-03-10 MED ORDER — ACETAMINOPHEN 650 MG RE SUPP: 650.0000 mg | Freq: Four times a day (QID) | RECTAL | Status: DC | PRN

## 2021-03-10 MED ORDER — LACTATED RINGERS IV SOLN: INTRAVENOUS | Status: DC

## 2021-03-10 MED ORDER — ACETAMINOPHEN 325 MG PO TABS: 650.0000 mg | ORAL_TABLET | Freq: Four times a day (QID) | ORAL | Status: DC | PRN

## 2021-03-10 MED ORDER — SODIUM CHLORIDE 0.9% FLUSH
3.0000 mL | Freq: Two times a day (BID) | INTRAVENOUS | Status: DC
  Administered 2021-03-10 – 2021-03-12 (×2): 3 mL via INTRAVENOUS

## 2021-03-10 MED ORDER — MORPHINE SULFATE (PF) 2 MG/ML IV SOLN: 2.0000 mg | INTRAVENOUS | Status: DC | PRN

## 2021-03-10 MED ORDER — HYDROCODONE-ACETAMINOPHEN 5-325 MG PO TABS
1.0000 | ORAL_TABLET | ORAL | Status: DC | PRN
Start: 2021-03-10 — End: 2021-03-13

## 2021-03-10 MED ORDER — HYDRALAZINE HCL 20 MG/ML IJ SOLN: 5.0000 mg | INTRAMUSCULAR | Status: DC | PRN

## 2021-03-10 MED ORDER — IOHEXOL 350 MG/ML SOLN
80.0000 mL | Freq: Once | INTRAVENOUS | Status: AC | PRN
  Administered 2021-03-10: 80 mL via INTRAVENOUS

## 2021-03-10 MED ORDER — HEPARIN (PORCINE) 25000 UT/250ML-% IV SOLN
1000.0000 [IU]/h | INTRAVENOUS | Status: DC
  Administered 2021-03-10: 1200 [IU]/h via INTRAVENOUS
  Administered 2021-03-11: 900 [IU]/h via INTRAVENOUS
  Administered 2021-03-12: 1000 [IU]/h via INTRAVENOUS
  Filled 2021-03-10 (×4): qty 250

## 2021-03-10 MED ORDER — ALPRAZOLAM 0.25 MG PO TABS
0.2500 mg | ORAL_TABLET | Freq: Every day | ORAL | Status: DC | PRN
  Administered 2021-03-12: 0.25 mg via ORAL
  Filled 2021-03-10: qty 1

## 2021-03-10 NOTE — Progress Notes (Signed)
ANTICOAGULATION CONSULT NOTE - Initial Consult  Pharmacy Consult for Heparin Indication: pulmonary embolus  No Known Allergies  Patient Measurements: Height: 5\' 4"  (162.6 cm) Weight: 70.3 kg (155 lb) IBW/kg (Calculated) : 54.7 Heparin Dosing Weight: 69 kg  Vital Signs: Temp: 99.7 F (37.6 C) (05/06 0719) Temp Source: Oral (05/06 0719) BP: 124/51 (05/06 0719) Pulse Rate: 67 (05/06 0719)  Labs: Recent Labs    03/09/21 2301 03/10/21 0117  HGB 14.0  --   HCT 42.4  --   PLT 196  --   CREATININE 1.20*  --   TROPONINIHS 6 5    Estimated Creatinine Clearance: 41.9 mL/min (A) (by C-G formula based on SCr of 1.2 mg/dL (H)).   Medical History: Past Medical History:  Diagnosis Date  . Hypercholesteremia   . Scleroderma (HCC)     Medications:  Infusions:  . heparin      Assessment: 73 YOF who presented on 5/6 with LUQ pain with CTA showing large saddle PE with RHS. Pharmacy consulted to start Heparin for anticoagulation.   The patient was not on blood thinners PTA and no history of recent surgeries or stroke per patient report. Baseline CBC wnl  Goal of Therapy:  Heparin level 0.3-0.7 units/ml Monitor platelets by anticoagulation protocol: Yes   Plan:  - Heparin 4500 unit bolus x 1 - Start Heparin at a rate of 1250 units/hr (12 ml/hr) - Will continue to monitor for any signs/symptoms of bleeding and will follow up with heparin level in 8 hours   Thank you for allowing pharmacy to be a part of this patient's care.  7/6, PharmD, BCPS Clinical Pharmacist Clinical phone for 03/10/2021: [phone] 03/10/2021 8:39 AM   **Pharmacist phone directory can now be found on [email or web address] ([email or web address]).  Listed under [facility].

## 2021-03-10 NOTE — Progress Notes (Signed)
  Echocardiogram 2D Echocardiogram has been performed.  Augustine Radar 03/10/2021, 9:05 AM

## 2021-03-10 NOTE — ED Notes (Signed)
Pt reports sharp, upper left quadrant pain that began 3/4 days prior that radiates to back, sudden onset. Pt reports pain with any movement, cough, ect. PMH stage 3 kidney failure.
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
## 2021-03-10 NOTE — Progress Notes (Signed)
Initial Nutrition Assessment  DOCUMENTATION CODES:   Not applicable  INTERVENTION:   - Encourage adequate PO intake  - ProSource Plus 30 ml po daily, each supplement provides 100 kcal and 15 grams of protein  - MVI with minerals daily  NUTRITION DIAGNOSIS:   Increased nutrient needs related to acute illness as evidenced by estimated needs.  GOAL:   Patient will meet greater than or equal to 90% of their needs  MONITOR:   PO intake,Labs,Weight trends  REASON FOR ASSESSMENT:   Consult Other ("nutritional goals")  ASSESSMENT:   70 year old female who presented to the ED on 5/05 with LUQ pain x 2 days. PMH of scleroderma, fibromyalgia, arthritis, CKD stage IIIa.   Pt admitted with large pulmonary embolus.  Spoke with pt at bedside. Pt reports decreased PO intake and early satiety over the last week. Pt reports that she is still eating but consuming 75% of the amount of food that she normally eats.  Pt reports that she is not a "big eater" at baseline. Pt typically consumes a light breakfast (banana and handful of grapes) and a light lunch (half a sandwich). Pt consumes a larger dinner meal (meat, starch, vegetable). Pt does report that she is lactose intolerant.  Pt reports that her UBW is 170 lbs. She last weighed this last summer prior to getting "very sick" with serotonin syndrome and kidney issues. During this time, pt lost about 20 lbs. She has been maintaining her weight between 150-155 lbs since that time.  Pt declines any oral nutrition supplements like Ensure and Boost. Pt requests help with ordering breakfast meal. Pt amenable to daily MVI with minerals. Will also order ProSource with morning meds.  Discussed importance of adequate PO intake in maintaining lean muscle mass and promoting healing. Pt expresses understanding.  Medications reviewed and include: colace, protonix, heparin IVF: LR @ 75 ml/hr  Labs reviewed: creatinine 1.20  NUTRITION - FOCUSED  PHYSICAL EXAM:  Flowsheet Row Most Recent Value  Orbital Region Mild depletion  Upper Arm Region No depletion  Thoracic and Lumbar Region No depletion  Buccal Region No depletion  Temple Region No depletion  Clavicle Bone Region No depletion  Clavicle and Acromion Bone Region No depletion  Scapular Bone Region No depletion  Dorsal Hand Mild depletion  Patellar Region No depletion  Anterior Thigh Region No depletion  Posterior Calf Region No depletion  Edema (RD Assessment) None  Hair Reviewed  Eyes Reviewed  Mouth Reviewed  Skin Reviewed  Nails Reviewed       Diet Order:   Diet Order            Diet regular Room service appropriate? Yes; Fluid consistency: Thin  Diet effective now                 EDUCATION NEEDS:   Education needs have been addressed  Skin:  Skin Assessment: Reviewed RN Assessment  Last BM:  03/08/21  Height:   Ht Readings from Last 1 Encounters:  03/10/21 5\' 4"  (1.626 m)    Weight:   Wt Readings from Last 1 Encounters:  03/10/21 70.1 kg    BMI:  Body mass index is 26.53 kg/m.  Estimated Nutritional Needs:   Kcal:  1700-1900  Protein:  80-95 grams  Fluid:  1.7-1.9 L    05/10/21, MS, RD, LDN Inpatient Clinical Dietitian Please see [facility] for contact information.

## 2021-03-10 NOTE — Progress Notes (Signed)
ANTICOAGULATION CONSULT NOTE  Pharmacy Consult for Heparin Indication: pulmonary embolus  No Known Allergies  Patient Measurements: Height: 5\' 4"  (162.6 cm) Weight: 70.1 kg (154 lb 8.7 oz) IBW/kg (Calculated) : 54.7 Heparin Dosing Weight: 69 kg  Vital Signs: Temp: 97.7 F (36.5 C) (05/06 1517) Temp Source: Oral (05/06 1517) BP: 135/59 (05/06 1517) Pulse Rate: 65 (05/06 1517)  Labs: Recent Labs    03/09/21 2301 03/10/21 0117 03/10/21 1642  HGB 14.0  --   --   HCT 42.4  --   --   PLT 196  --   --   HEPARINUNFRC  --   --  >1.10*  CREATININE 1.20*  --   --   TROPONINIHS 6 5  --     Estimated Creatinine Clearance: 41.9 mL/min (A) (by C-G formula based on SCr of 1.2 mg/dL (H)).   Medical History: Past Medical History:  Diagnosis Date  . Arthritis   . Fibromyalgia   . Hypercholesteremia   . Scleroderma (HCC)   . Stage 3a chronic kidney disease (HCC)     Medications:  Infusions:  . heparin Stopped (03/10/21 1811)  . lactated ringers Stopped (03/10/21 1617)    Assessment: 69 YOF who presented on 5/6 with LUQ pain with CTA showing large saddle PE with RHS. Pharmacy consulted to start Heparin for anticoagulation.   The patient was not on blood thinners PTA and no history of recent surgeries or stroke per patient report. Baseline CBC wnl  Initial heparin level >1.1. Lab drawn opposite side as IV per nursing. No bleeding issues noted.   Goal of Therapy:  Heparin level 0.3-0.7 units/ml Monitor platelets by anticoagulation protocol: Yes   Plan:  - Hold heparin for 1 hour - Restart Heparin at a rate of 1000 units/hr - Will continue to monitor for any signs/symptoms of bleeding and will follow up with heparin level in 8 hours   Thank you for allowing pharmacy to be a part of this patient's care.  7/6 PharmD., BCPS Clinical Pharmacist 03/10/2021 7:05 PM

## 2021-03-11 DIAGNOSIS — E785 Hyperlipidemia, unspecified: Secondary | ICD-10-CM | POA: Diagnosis not present

## 2021-03-11 DIAGNOSIS — I2602 Saddle embolus of pulmonary artery with acute cor pulmonale: Secondary | ICD-10-CM | POA: Diagnosis not present

## 2021-03-11 DIAGNOSIS — E039 Hypothyroidism, unspecified: Secondary | ICD-10-CM | POA: Diagnosis not present

## 2021-03-11 DIAGNOSIS — I10 Essential (primary) hypertension: Secondary | ICD-10-CM | POA: Diagnosis not present

## 2021-03-11 LAB — CBC
HCT: 38.6 % (ref 36.0–46.0)
Hemoglobin: 12.7 g/dL (ref 12.0–15.0)
MCH: 31.1 pg (ref 26.0–34.0)
MCHC: 32.9 g/dL (ref 30.0–36.0)
MCV: 94.4 fL (ref 80.0–100.0)
Platelets: 180 10*3/uL (ref 150–400)
RBC: 4.09 MIL/uL (ref 3.87–5.11)
RDW: 12.4 % (ref 11.5–15.5)
WBC: 9.4 10*3/uL (ref 4.0–10.5)
nRBC: 0 % (ref 0.0–0.2)

## 2021-03-11 LAB — BASIC METABOLIC PANEL
Anion gap: 8 (ref 5–15)
BUN: 11 mg/dL (ref 8–23)
CO2: 24 mmol/L (ref 22–32)
Calcium: 8.7 mg/dL — ABNORMAL LOW (ref 8.9–10.3)
Chloride: 108 mmol/L (ref 98–111)
Creatinine, Ser: 1.12 mg/dL — ABNORMAL HIGH (ref 0.44–1.00)
GFR, Estimated: 53 mL/min — ABNORMAL LOW (ref >60)
Glucose, Bld: 82 mg/dL (ref 70–99)
Potassium: 5.1 mmol/L (ref 3.5–5.1)
Sodium: 140 mmol/L (ref 135–145)

## 2021-03-11 LAB — HEPARIN LEVEL (UNFRACTIONATED)
Heparin Unfractionated: 0.78 IU/mL — ABNORMAL HIGH (ref 0.30–0.70)
Heparin Unfractionated: 0.41 IU/mL (ref 0.30–0.70)
Heparin Unfractionated: 0.38 IU/mL (ref 0.30–0.70)

## 2021-03-11 LAB — URINE CULTURE

## 2021-03-11 NOTE — Progress Notes (Signed)
ANTICOAGULATION CONSULT NOTE Pharmacy Consult for Heparin Indication: pulmonary embolus  No Known Allergies  Patient Measurements: Height: 5\' 4"  (162.6 cm) Weight: 70.1 kg (154 lb 8.7 oz) IBW/kg (Calculated) : 54.7 Heparin Dosing Weight: 69 kg  Vital Signs: Temp: 98.3 F (36.8 C) (05/06 2035) Temp Source: Oral (05/06 2035) BP: 130/69 (05/06 2035) Pulse Rate: 73 (05/06 2035)  Labs: Recent Labs    03/09/21 2301 03/10/21 0117 03/10/21 1642 03/11/21 0343  HGB 14.0  --   --  12.7  HCT 42.4  --   --  38.6  PLT 196  --   --  180  HEPARINUNFRC  --   --  >1.10* 0.78*  CREATININE 1.20*  --   --   --   TROPONINIHS 6 5  --   --     Estimated Creatinine Clearance: 41.9 mL/min (A) (by C-G formula based on SCr of 1.2 mg/dL (H)).  Assessment: 70 y.o. female with PE for heparin Goal of Therapy:  Heparin level 0.3-0.7 units/ml Monitor platelets by anticoagulation protocol: Yes   Plan:  Decrease Heparin 900 units/hr Check heparin level in 8 hours.  65, PharmD, BCPS 03/11/2021 4:34 AM

## 2021-03-11 NOTE — Progress Notes (Signed)
PROGRESS NOTE    Denise Weiss  ZOX:[id number] DOB: 08-21-1951 DOA: 03/09/2021 PCP: [name], MD    Chief Complaint  Patient presents with  . Abdominal Pain    Brief Narrative:   Denise Weiss is a 70 y.o. female with medical history significant of scleroderma; stage 3 CKD; hypothyroidism; and HTN presenting with LUQ pain. CT angiogram showed Very large pulmonary embolus including central and peripheral saddle emboli. Involvement of all lobes. Clot burden is high. Right ventricular left ventricular ratio is abnormally elevated at 1.0, indicating right heart strain. Acute DVT in the left common femoral vein and superficial femoral vein.Peripheral airspace opacity in the left lower lobe compatible with pulmonary hemorrhage and/or infarct. Trace left pleural effusion. He was started on IV heparin and referred to [facility] for admission.    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  Echocardiogram ordered.  In view of her hemodynamic stability, oxygenation normal on room air she was started on IV heparin.  In view of her high clot burden we will continue with IV heparin for 72 hours and transition to DOAC on discharge.  Will need outpatient follow-up with PCP to check for underlying malignancy given imaging results and pericardial lymph nodal enlargement.     Breast abnormality on CT Her last mammogram was in the 40s.  She was recommended to follow-up with a mammogram as soon as possible.    Scleroderma/Raynaud's phenomenon   Hypertension Blood pressure parameters appear to be optimal.   Mild acute on  stage IIIa CKD Creatinine appears to  have improved with IV fluids.     Hypothyroidism Continue with Synthroid.   Hyperlipidemia Continue with the Lipitor.  Leukocytosis;  Probably reactive, resolved.   DVT prophylaxis: heparin.  Code Status: DNR Family Communication: None at bedside disposition:   Status is: Inpatient  Remains inpatient appropriate because:Ongoing diagnostic testing needed not appropriate for outpatient work up and IV treatments appropriate due to intensity of illness or inability to take PO   Dispo: The patient is from: Home              Anticipated d/c is to: Home              Patient currently is not medically stable to d/c.   Difficult to place patient No       Consultants:   None.    Procedures: echocardiogram.    Antimicrobials: none.    Subjective: Chest pain improving, sob improved.   Objective: Vitals:   03/10/21 1347 03/10/21 1517 03/10/21 2035 03/11/21 0448  BP: (!) 148/64 (!) 135/59 130/69 (!) 123/50  Pulse: 72 65 73 64  Resp: Temp: 98.3 F (36.8 C) 97.7 F (36.5 C) 98.3 F (36.8 C) 98.4 F (36.9 C)  TempSrc: Oral Oral Oral Oral  SpO2: 96% 94% 96% 94%  Weight: 70.1 kg     Height:  (1.626 m)       Intake/Output Summary (Last 24 hours) at 03/11/2021 1231 Last data filed at 03/11/2021 0452 Gross per 24 hour  Intake 805.35 ml  Output 500 ml  Net 305.35 ml  Filed Weights   03/09/21 2240 03/10/21 1347  Weight: 70.3 kg 70.1 kg    Examination:  General exam: Appears calm and comfortable  Respiratory system: Clear to auscultation. Respiratory effort normal. Cardiovascular system: S1 & S2 heard, RRR. No pedal edema.  Gastrointestinal system: Abdomen is nondistended, soft and nontender.  Normal bowel sounds heard. Central nervous system: Alert and oriented. No focal neurological deficits. Extremities: Symmetric 5 x 5 power. Skin: No rashes, lesions or ulcers Psychiatry: Mood & affect appropriate.     Data Reviewed: I have personally  reviewed following labs and imaging studies  CBC: Recent Labs  Lab 03/09/21 2301 03/11/21 0343  WBC 12.8* 9.4  NEUTROABS 10.1*  --   HGB 14.0 12.7  HCT 42.4 38.6  MCV 94.6 94.4  PLT 196 180    Basic Metabolic Panel: Recent Labs  Lab 03/09/21 2301 03/11/21 0343  NA 136 140  K 4.0 5.1  CL 102 108  CO2 27 24  GLUCOSE 119* 82  BUN 12 11  CREATININE 1.20* 1.12*  CALCIUM 8.9 8.7*    GFR: Estimated Creatinine Clearance: 44.9 mL/min (A) (by C-G formula based on SCr of 1.12 mg/dL (H)).  Liver Function Tests: Recent Labs  Lab 03/09/21 2301  AST 18  ALT 12  ALKPHOS 94  BILITOT 2.5*  PROT 6.4*  ALBUMIN 3.5    CBG: No results for input(s): GLUCAP in the last 168 hours.   Recent Results (from the past 240 hour(s))  Urine culture     Status: Abnormal   Collection Time: 03/10/21  6:29 AM   Specimen: Urine, Random  Result Value Ref Range Status   Specimen Description URINE, RANDOM  Final   Special Requests   Final    NONE Performed at [facility], [address] [place], [place] [address]    Culture MULTIPLE SPECIES PRESENT, SUGGEST RECOLLECTION (A)  Final   Report Status 03/11/2021 FINAL  Final  Resp Panel by RT-PCR (Flu A&B, Covid) Nasopharyngeal Swab     Status: None   Collection Time: 03/10/21  1:11 PM   Specimen: Nasopharyngeal Swab; Nasopharyngeal(NP) swabs in vial transport medium  Result Value Ref Range Status   SARS Coronavirus 2 by RT PCR NEGATIVE NEGATIVE Final    Comment: (NOTE) SARS-CoV-2 target nucleic acids are NOT DETECTED.  The SARS-CoV-2 RNA is generally detectable in upper respiratory specimens during the acute phase of infection. The lowest concentration of SARS-CoV-2 viral copies this assay can detect is 138 copies/mL. A negative result does not preclude SARS-Cov-2 infection and should not be used as the sole basis for treatment or other patient management decisions. A negative result may occur with  improper specimen  collection/handling, submission of specimen other than nasopharyngeal swab, presence of viral mutation(s) within the areas targeted by this assay, and inadequate number of viral copies(<138 copies/mL). A negative result must be combined with clinical observations, patient history, and epidemiological information. The expected result is Negative.  Fact Sheet for Patients:  BloggerCourse.com  Fact Sheet for Healthcare Providers:  SeriousBroker.it  This test is no t yet approved or cleared by the [place] FDA and  has been authorized for detection and/or diagnosis of SARS-CoV-2 by FDA under an Emergency Use Authorization (EUA). This EUA will remain  in effect (meaning this test can be used) for the duration of the COVID-19 declaration under Section 564(b)(1) of the Act, 21 U.S.C.section 360bbb-3(b)(1), unless the authorization is terminated  or revoked sooner.  Influenza A by PCR NEGATIVE NEGATIVE Final   Influenza B by PCR NEGATIVE NEGATIVE Final    Comment: (NOTE) The Xpert Xpress SARS-CoV-2/FLU/RSV plus assay is intended as an aid in the diagnosis of influenza from Nasopharyngeal swab specimens and should not be used as a sole basis for treatment. Nasal washings and aspirates are unacceptable for Xpert Xpress SARS-CoV-2/FLU/RSV testing.  Fact Sheet for Patients: BloggerCourse.com  Fact Sheet for Healthcare Providers: SeriousBroker.it  This test is not yet approved or cleared by the [place] FDA and has been authorized for detection and/or diagnosis of SARS-CoV-2 by FDA under an Emergency Use Authorization (EUA). This EUA will remain in effect (meaning this test can be used) for the duration of the COVID-19 declaration under Section 564(b)(1) of the Act, 21 U.S.C. section 360bbb-3(b)(1), unless the authorization is terminated or revoked.  Performed at [facility], [address], [place], [place] [address]          Radiology Studies: DG Chest 2 View  Result Date: 03/09/2021 CLINICAL DATA:  Left chest pain, left upper quadrant pain for 2 days EXAM: CHEST - 2 VIEW COMPARISON:  None. FINDINGS: Dome-shaped, pleural based opacification in the periphery of the left lung base, could reflect acute airspace consolidation or possible Hampton's hump which can be seen in the setting of pulmonary artery embolus. No pneumothorax. No visible layering effusion. The cardiomediastinal contours are unremarkable. No acute osseous or soft tissue abnormality. Degenerative changes are present in the imaged spine and shoulders. IMPRESSION: Pleural-based opacification the periphery of the lung base, could reflect acute airspace disease/pneumonia or possible Hampton hump which can be seen in the setting of pulmonary embolus. Electronically Signed   By: Kreg Shropshire M.D.   On: 03/09/2021 23:22   CT Angio Chest PE W and/or Wo Contrast  Result Date: 03/10/2021 CLINICAL DATA:  Abdominal abscess. Left chest pain. History of DVT. EXAM: CT ANGIOGRAPHY CHEST CT ABDOMEN AND PELVIS WITH CONTRAST TECHNIQUE: Multidetector CT imaging of the chest was performed using the standard protocol during bolus administration of intravenous contrast. Multiplanar CT image reconstructions and MIPs were obtained to evaluate the vascular anatomy. Multidetector CT imaging of the abdomen and pelvis was performed using the standard protocol during bolus administration of intravenous contrast. CONTRAST:  80mL OMNIPAQUE IOHEXOL 350 MG/ML SOLN COMPARISON:  Chest radiograph 03/09/2021 FINDINGS: CTA CHEST FINDINGS Cardiovascular: Large saddle embolus spanning between the right and left pulmonary arteries. Lobar pulmonary emboli in the right upper lobe, spanning between the right middle lobe and right lower lobe, and spanning between the left upper lobe and left lower lobe. Clot burden is high. Right  ventricular to left ventricular ratio 1.0. Aortic arch and left anterior descending coronary artery atherosclerotic calcification. Mediastinum/Nodes: Suspected lymph node versus less likely pleural-based nodule along the left pericardial margin posteriorly measuring 1.0 by 1.3 cm on image 160 of series 4, just below the left pulmonary vein. AP window lymph node 0.7 cm in short axis on image 92 series 4. Asymmetric nodularity in the right breast tissues compared to the left, follow up diagnostic mammographic assessment in the nonacute setting is recommended. Lungs/Pleura: Subpleural triangular nodularity in the right upper lobe 0.7 by 0.5 cm on image 33 series 5, probably from atelectasis or scarring given the triangular morphology. Peripheral airspace opacity in the left lower lobe on image 58 of series 5 may reflect pulmonary hemorrhage or infarct. Trace left pleural effusion. As noted above, there is nodularity below the left pulmonary vein which  may represent a pleural-based nodule or a pericardial lymph node, measuring 1.0 by 1.3 cm, likely meriting surveillance. Musculoskeletal: Mild thoracic spondylosis. Review of the MIP images confirms the above findings. CT ABDOMEN and PELVIS FINDINGS Hepatobiliary: Four hypodense lesions in the left hepatic lobe are likely cysts but technically too small to characterize. Gallbladder unremarkable. Pancreas: Unremarkable Spleen: Unremarkable Adrenals/Urinary Tract: Unremarkable Stomach/Bowel: Unremarkable Vascular/Lymphatic: Aortoiliac atherosclerotic vascular disease. Filling defect in the left common femoral vein and superficial femoral vein compatible with DVT. No pathologic adenopathy in the abdomen/pelvis. Reproductive: Unremarkable Other: No supplemental non-categorized findings. Musculoskeletal: Deformity of the left hip with suspected healed left hip fracture, and severe degenerative arthropathy and remodeling of the left hip compatible with chronic arthropathy.  Moderate degenerative arthropathy of the right hip. Lower lumbar spondylosis and degenerative disc disease with grade 1 anterolisthesis at L4-5, and right foraminal impingement at the L4-5 level. Review of the MIP images confirms the above findings. IMPRESSION: 1. Very large pulmonary embolus including central and peripheral saddle emboli. Involvement of all lobes. Clot burden is high. Right ventricular left ventricular ratio is abnormally elevated at 1.0, indicating right heart strain. 2. Acute DVT in the left common femoral vein and superficial femoral vein. 3. Peripheral airspace opacity in the left lower lobe compatible with pulmonary hemorrhage and/or infarct. Trace left pleural effusion. 4. Nodularity in the right breast is asymmetric and warrants follow up diagnostic mammography in the nonacute setting. 5. 1.0 by 1.3 cm nodule below the left pulmonary vein is probably a borderline enlarged pericardial lymph node but could represent a peripheral pulmonary nodule. If mammographic workup turns out negative than I would suggest either PET-CT or follow up CT in 3 months time to reassess this lesion. 6. 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 Right foraminal impingement at L4-5. Aortic Atherosclerosis (ICD10-I70.0). Coronary atherosclerosis. Hypodense left hepatic lobe lesions are likely benign but technically too small to characterize. Critical Value/emergent results were called by telephone at the time of interpretation on 03/10/2021 at 7:31 am to provider [facility] , who verbally acknowledged these results. Electronically Signed   By: Gaylyn Rong M.D.   On: 03/10/2021 07:41   CT ABDOMEN PELVIS W CONTRAST  Result Date: 03/10/2021 CLINICAL DATA:  Abdominal abscess. Left chest pain. History of DVT. EXAM: CT ANGIOGRAPHY CHEST CT ABDOMEN AND PELVIS WITH CONTRAST TECHNIQUE: Multidetector CT imaging of the chest was performed using the standard  protocol during bolus administration of intravenous contrast. Multiplanar CT image reconstructions and MIPs were obtained to evaluate the vascular anatomy. Multidetector CT imaging of the abdomen and pelvis was performed using the standard protocol during bolus administration of intravenous contrast. CONTRAST:  80mL OMNIPAQUE IOHEXOL 350 MG/ML SOLN COMPARISON:  Chest radiograph 03/09/2021 FINDINGS: CTA CHEST FINDINGS Cardiovascular: Large saddle embolus spanning between the right and left pulmonary arteries. Lobar pulmonary emboli in the right upper lobe, spanning between the right middle lobe and right lower lobe, and spanning between the left upper lobe and left lower lobe. Clot burden is high. Right ventricular to left ventricular ratio 1.0. Aortic arch and left anterior descending coronary artery atherosclerotic calcification. Mediastinum/Nodes: Suspected lymph node versus less likely pleural-based nodule along the left pericardial margin posteriorly measuring 1.0 by 1.3 cm on image 160 of series 4, just below the left pulmonary vein. AP window lymph node 0.7 cm in short axis on image 92 series 4. Asymmetric nodularity in the right breast tissues compared to the left, follow up diagnostic  mammographic assessment in the nonacute setting is recommended. Lungs/Pleura: Subpleural triangular nodularity in the right upper lobe 0.7 by 0.5 cm on image 33 series 5, probably from atelectasis or scarring given the triangular morphology. Peripheral airspace opacity in the left lower lobe on image 58 of series 5 may reflect pulmonary hemorrhage or infarct. Trace left pleural effusion. As noted above, there is nodularity below the left pulmonary vein which may represent a pleural-based nodule or a pericardial lymph node, measuring 1.0 by 1.3 cm, likely meriting surveillance. Musculoskeletal: Mild thoracic spondylosis. Review of the MIP images confirms the above findings. CT ABDOMEN and PELVIS FINDINGS Hepatobiliary: Four  hypodense lesions in the left hepatic lobe are likely cysts but technically too small to characterize. Gallbladder unremarkable. Pancreas: Unremarkable Spleen: Unremarkable Adrenals/Urinary Tract: Unremarkable Stomach/Bowel: Unremarkable Vascular/Lymphatic: Aortoiliac atherosclerotic vascular disease. Filling defect in the left common femoral vein and superficial femoral vein compatible with DVT. No pathologic adenopathy in the abdomen/pelvis. Reproductive: Unremarkable Other: No supplemental non-categorized findings. Musculoskeletal: Deformity of the left hip with suspected healed left hip fracture, and severe degenerative arthropathy and remodeling of the left hip compatible with chronic arthropathy. Moderate degenerative arthropathy of the right hip. Lower lumbar spondylosis and degenerative disc disease with grade 1 anterolisthesis at L4-5, and right foraminal impingement at the L4-5 level. Review of the MIP images confirms the above findings. IMPRESSION: 1. Very large pulmonary embolus including central and peripheral saddle emboli. Involvement of all lobes. Clot burden is high. Right ventricular left ventricular ratio is abnormally elevated at 1.0, indicating right heart strain. 2. Acute DVT in the left common femoral vein and superficial femoral vein. 3. Peripheral airspace opacity in the left lower lobe compatible with pulmonary hemorrhage and/or infarct. Trace left pleural effusion. 4. Nodularity in the right breast is asymmetric and warrants follow up diagnostic mammography in the nonacute setting. 5. 1.0 by 1.3 cm nodule below the left pulmonary vein is probably a borderline enlarged pericardial lymph node but could represent a peripheral pulmonary nodule. If mammographic workup turns out negative than I would suggest either PET-CT or follow up CT in 3 months time to reassess this lesion. 6. 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 Right  foraminal impingement at L4-5. Aortic Atherosclerosis (ICD10-I70.0). Coronary atherosclerosis. Hypodense left hepatic lobe lesions are likely benign but technically too small to characterize. Critical Value/emergent results were called by telephone at the time of interpretation on 03/10/2021 at 7:31 am to [facility] , who verbally acknowledged these results. Electronically Signed   By: Gaylyn RongWalter  Liebkemann M.D.   On: 03/10/2021 07:41   ECHOCARDIOGRAM LIMITED  Result Date: 03/10/2021    ECHOCARDIOGRAM LIMITED REPORT   Patient Name:   Jennelle HumanSUSAN A Franzel Date of Exam: 03/10/2021 Medical Rec #:  [id number]       Height:       64.0 in Accession #:    [id number]      Weight:       155.0 lb Date of Birth:  01/10/1951       BSA:          1.756 m Patient Age:    69 years        BP:           124/51 mmHg Patient Gender: F               HR:           67 bpm. Exam Location:  Inpatient Procedure:  Limited Echo, Limited Color Doppler and Cardiac Doppler                    STAT ECHO Reported to: Dr. Flora Lipps on 03/10/2021 9:04:00 AM. Indications:    Pulmonary Embolus I26.09  History:        Patient has no prior history of Echocardiogram examinations.  Sonographer:    Eulah Pont RDCS Referring Phys: [id number] JENNIFER YATES IMPRESSIONS  1. Left ventricular ejection fraction, by estimation, is 60 to 65%. The left ventricle has normal function. The left ventricle has no regional wall motion abnormalities. Left ventricular diastolic parameters were normal.  2. The right ventricular size is normal. There is mildly elevated pulmonary artery systolic pressure. The estimated right ventricular systolic pressure is 37.1 mmHg.  3. The mitral valve is grossly normal. Mild mitral valve regurgitation. No evidence of mitral stenosis.  4. The aortic valve is tricuspid. There is mild thickening of the aortic valve. Aortic valve regurgitation is mild. No aortic stenosis is present.  5. The inferior vena cava is normal in size with greater than 50%  respiratory variability, suggesting right atrial pressure of 3 mmHg. FINDINGS  Left Ventricle: Left ventricular ejection fraction, by estimation, is 60 to 65%. The left ventricle has normal function. The left ventricle has no regional wall motion abnormalities. The left ventricular internal cavity size was normal in size. There is  no left ventricular hypertrophy. Left ventricular diastolic parameters were normal. Right Ventricle: The right ventricular size is normal. No increase in right ventricular wall thickness. There is mildly elevated pulmonary artery systolic pressure. The tricuspid regurgitant velocity is 2.92 m/s, and with an assumed right atrial pressure  of 3 mmHg, the estimated right ventricular systolic pressure is 37.1 mmHg. Left Atrium: Left atrial size was normal in size. Right Atrium: Right atrial size was normal in size. Pericardium: Trivial pericardial effusion is present. Mitral Valve: The mitral valve is grossly normal. Mild mitral valve regurgitation. No evidence of mitral valve stenosis. Tricuspid Valve: The tricuspid valve is grossly normal. Tricuspid valve regurgitation is mild . No evidence of tricuspid stenosis. Aortic Valve: The aortic valve is tricuspid. There is mild thickening of the aortic valve. Aortic valve regurgitation is mild. No aortic stenosis is present. Pulmonic Valve: The pulmonic valve was grossly normal. Pulmonic valve regurgitation is not visualized. No evidence of pulmonic stenosis. Aorta: The aortic root is normal in size and structure. Venous: The right upper pulmonary vein is normal. The inferior vena cava is normal in size with greater than 50% respiratory variability, suggesting right atrial pressure of 3 mmHg. LEFT VENTRICLE PLAX 2D LVIDd:         3.90 cm  Diastology LVIDs:         2.40 cm  LV e' medial:    7.43 cm/s LV PW:         0.70 cm  LV E/e' medial:  12.9 LV IVS:        0.90 cm  LV e' lateral:   6.90 cm/s LVOT diam:     2.00 cm  LV E/e' lateral: 13.8 LV SV:          89 LV SV Index:   51 LVOT Area:     3.14 cm  RIGHT VENTRICLE RV S prime:     9.30 cm/s TAPSE (M-mode): 2.1 cm LEFT ATRIUM         Index LA diam:    2.90 cm 1.65 cm/m  AORTIC VALVE LVOT Vmax:  102.00 cm/s LVOT Vmean:  78.100 cm/s LVOT VTI:    0.283 m  AORTA Ao Root diam: 3.30 cm MITRAL VALVE                TRICUSPID VALVE MV Area (PHT): 2.96 cm     TR Peak grad:   34.1 mmHg MV Decel Time: 256 msec     TR Vmax:        292.00 cm/s MV E velocity: 95.50 cm/s MV A velocity: 111.00 cm/s  SHUNTS MV E/A ratio:  0.86         Systemic VTI:  0.28 m                             Systemic Diam: 2.00 cm Lennie Odor MD Electronically signed by Lennie Odor MD Signature Date/Time: 03/10/2021/10:38:45 AM    Final         Scheduled Meds: . (feeding supplement) PROSource Plus  30 mL Oral Daily  . ARIPiprazole  5 mg Oral QHS  . atenolol  50 mg Oral BID  . atorvastatin  20 mg Oral q1800  . docusate sodium  100 mg Oral BID  . levothyroxine  88 mcg Oral q morning  . multivitamin with minerals  1 tablet Oral Daily  . pantoprazole  40 mg Oral Daily  . sodium chloride flush  3 mL Intravenous Q12H  . traMADol  100 mg Oral Q12H  . vortioxetine HBr  5 mg Oral Daily   Continuous Infusions: . heparin 900 Units/hr (03/11/21 0451)  . lactated ringers 75 mL/hr at 03/11/21 1050     LOS: 1 day        Kathlen Mody, MD [facility]   To contact the attending provider between 7A-7P or the covering provider during after hours 7P-7A, please log into the web site [email or web address] and access using universal [place] password for that web site. If you do not have the password, please call the hospital operator.  03/11/2021, 12:32 PM

## 2021-03-11 NOTE — Progress Notes (Signed)
ANTICOAGULATION CONSULT NOTE - Follow Up Consult  Pharmacy Consult for Heparin Indication: pulmonary embolus and DVT  No Known Allergies  Patient Measurements: Height: 5\' 4"  (162.6 cm) Weight: 70.1 kg (154 lb 8.7 oz) IBW/kg (Calculated) : 54.7 kg Heparin Dosing Weight: 69 kg  Vital Signs: Temp: 98.4 F (36.9 C) (05/07 2013) Temp Source: Oral (05/07 2013) BP: 119/54 (05/07 2013) Pulse Rate: 60 (05/07 1445)  Labs: Recent Labs    03/09/21 2301 03/10/21 0117 03/10/21 1642 03/11/21 0343 03/11/21 1121 03/11/21 1913  HGB 14.0  --   --  12.7  --   --   HCT 42.4  --   --  38.6  --   --   PLT 196  --   --  180  --   --   HEPARINUNFRC  --   --    < > 0.78* 0.41 0.38  CREATININE 1.20*  --   --  1.12*  --   --   TROPONINIHS 6 5  --   --   --   --    < > = values in this interval not displayed.    Estimated Creatinine Clearance: 44.9 mL/min (A) (by C-G formula based on SCr of 1.12 mg/dL (H)).   Medical History: Past Medical History:  Diagnosis Date  . Arthritis   . Fibromyalgia   . Hypercholesteremia   . Scleroderma (HCC)   . Stage 3a chronic kidney disease (HCC)     Medications:  Infusions:  . heparin 900 Units/hr (03/11/21 0451)  . lactated ringers 75 mL/hr at 03/11/21 1050    Assessment: 70 yo female presented with with LUQ pain and a CTA showing a large saddle PE with RHS and an acute DVT in the left common femoral vein and superficial femoral vein. PTA the patient is not on anticoagulation. Pharmacy is consulted to dose heparin.  Heparin confirmatory level remains therapeutic with slight downtrend to 0.38 on 900 units/hr  Goal of Therapy:  Heparin level 0.3-0.7 units/ml Monitor platelets by anticoagulation protocol: Yes   Plan:  Increase heparin gtt slightly to 950 units/hr Confirm with AM labs F/u long term AC plan and transition to PO  66, PharmD Clinical Pharmacist ED Pharmacist Phone # [phone] 03/11/2021 8:25 PM

## 2021-03-11 NOTE — Progress Notes (Addendum)
ANTICOAGULATION CONSULT NOTE - Follow Up Consult  Pharmacy Consult for Heparin Indication: pulmonary embolus and DVT  No Known Allergies  Patient Measurements: Height: 5\' 4"  (162.6 cm) Weight: 70.1 kg (154 lb 8.7 oz) IBW/kg (Calculated) : 54.7 kg Heparin Dosing Weight: 69 kg  Vital Signs: Temp: 98.4 F (36.9 C) (05/07 0448) Temp Source: Oral (05/07 0448) BP: 123/50 (05/07 0448) Pulse Rate: 64 (05/07 0448)  Labs: Recent Labs    03/09/21 2301 03/10/21 0117 03/10/21 1642 03/11/21 0343  HGB 14.0  --   --  12.7  HCT 42.4  --   --  38.6  PLT 196  --   --  180  HEPARINUNFRC  --   --  >1.10* 0.78*  CREATININE 1.20*  --   --  1.12*  TROPONINIHS 6 5  --   --     Estimated Creatinine Clearance: 44.9 mL/min (A) (by C-G formula based on SCr of 1.12 mg/dL (H)).   Medical History: Past Medical History:  Diagnosis Date  . Arthritis   . Fibromyalgia   . Hypercholesteremia   . Scleroderma (HCC)   . Stage 3a chronic kidney disease (HCC)     Medications:  Infusions:  . heparin 900 Units/hr (03/11/21 0451)  . lactated ringers 75 mL/hr at 03/10/21 2035    Assessment: 70 yo female presented with with LUQ pain and a CTA showing a large saddle PE with RHS and an acute DVT in the left common femoral vein and superficial femoral vein. PTA the patient is not on anticoagulation. Pharmacy is consulted to dose heparin.  Heparin level is therapeutic at 0.41 while running at 900 units/hr. Per the RN, there have been no issues with the infusion and the patient is without signs or symptoms of bleeding. Hgb 12.7, platelets 180.   Goal of Therapy:  Heparin level 0.3-0.7 units/ml Monitor platelets by anticoagulation protocol: Yes   Plan:  -Continue heparin IV at 900 units/hr -Obtain a confirmatory 8-hr heparin level -Obtain a daily heparin level and CBC  -Monitor for signs and symptoms of bleeding  Thank you for allowing pharmacy to be a part of this patient's care.   66, PharmD, RPh  PGY-1 Pharmacy Resident 03/11/2021 8:22 AM  Please check [email or web address] for unit-specific pharmacy phone numbers.

## 2021-03-12 DIAGNOSIS — I10 Essential (primary) hypertension: Secondary | ICD-10-CM | POA: Diagnosis not present

## 2021-03-12 DIAGNOSIS — I2602 Saddle embolus of pulmonary artery with acute cor pulmonale: Secondary | ICD-10-CM | POA: Diagnosis not present

## 2021-03-12 DIAGNOSIS — E785 Hyperlipidemia, unspecified: Secondary | ICD-10-CM | POA: Diagnosis not present

## 2021-03-12 DIAGNOSIS — E039 Hypothyroidism, unspecified: Secondary | ICD-10-CM | POA: Diagnosis not present

## 2021-03-12 LAB — BASIC METABOLIC PANEL
Anion gap: 5 (ref 5–15)
BUN: 10 mg/dL (ref 8–23)
CO2: 29 mmol/L (ref 22–32)
Calcium: 8.5 mg/dL — ABNORMAL LOW (ref 8.9–10.3)
Chloride: 107 mmol/L (ref 98–111)
Creatinine, Ser: 1.19 mg/dL — ABNORMAL HIGH (ref 0.44–1.00)
GFR, Estimated: 49 mL/min — ABNORMAL LOW (ref >60)
Glucose, Bld: 102 mg/dL — ABNORMAL HIGH (ref 70–99)
Potassium: 4.4 mmol/L (ref 3.5–5.1)
Sodium: 141 mmol/L (ref 135–145)

## 2021-03-12 LAB — CBC
HCT: 35.2 % — ABNORMAL LOW (ref 36.0–46.0)
Hemoglobin: 11.5 g/dL — ABNORMAL LOW (ref 12.0–15.0)
MCH: 30.7 pg (ref 26.0–34.0)
MCHC: 32.7 g/dL (ref 30.0–36.0)
MCV: 93.9 fL (ref 80.0–100.0)
Platelets: 190 10*3/uL (ref 150–400)
RBC: 3.75 MIL/uL — ABNORMAL LOW (ref 3.87–5.11)
RDW: 12.5 % (ref 11.5–15.5)
WBC: 6.9 10*3/uL (ref 4.0–10.5)
nRBC: 0 % (ref 0.0–0.2)

## 2021-03-12 LAB — HEPARIN LEVEL (UNFRACTIONATED)
Heparin Unfractionated: 0.35 IU/mL (ref 0.30–0.70)
Heparin Unfractionated: 0.48 IU/mL (ref 0.30–0.70)

## 2021-03-12 NOTE — Progress Notes (Signed)
ANTICOAGULATION CONSULT NOTE - Follow Up Consult  Pharmacy Consult for Heparin Indication: pulmonary embolus and DVT  No Known Allergies  Patient Measurements: Height: 5\' 4"  (162.6 cm) Weight: 70.1 kg (154 lb 8.7 oz) IBW/kg (Calculated) : 54.7 kg Heparin Dosing Weight: 69 kg  Vital Signs: Temp: 98.3 F (36.8 C) (05/08 1616) Temp Source: Oral (05/08 1616) BP: 118/64 (05/08 1200) Pulse Rate: 66 (05/08 1200)  Labs: Recent Labs    03/09/21 2301 03/10/21 0117 03/10/21 1642 03/11/21 0343 03/11/21 1121 03/11/21 1913 03/12/21 0104 03/12/21 0930 03/12/21 1648  HGB 14.0  --   --  12.7  --   --  11.5*  --   --   HCT 42.4  --   --  38.6  --   --  35.2*  --   --   PLT 196  --   --  180  --   --  190  --   --   HEPARINUNFRC  --   --    < > 0.78*   < > 0.38 0.35  --  0.48  CREATININE 1.20*  --   --  1.12*  --   --   --  1.19*  --   TROPONINIHS 6 5  --   --   --   --   --   --   --    < > = values in this interval not displayed.    Estimated Creatinine Clearance: 42.3 mL/min (A) (by C-G formula based on SCr of 1.19 mg/dL (H)).   Medical History: Past Medical History:  Diagnosis Date  . Arthritis   . Fibromyalgia   . Hypercholesteremia   . Scleroderma (HCC)   . Stage 3a chronic kidney disease (HCC)     Medications:  Infusions:  . heparin 1,000 Units/hr (03/12/21 1617)  . lactated ringers 75 mL/hr at 03/12/21 1617    Assessment: 70 yo female presented with with LUQ pain and a CTA showing a large saddle PE with RHS and an acute DVT in the left common femoral vein and superficial femoral vein. PTA the patient is not on anticoagulation. Pharmacy is consulted to dose heparin.  Heparin level remains therapeutic at 0.48.  Goal of Therapy:  Heparin level 0.3-0.7 units/ml Monitor platelets by anticoagulation protocol: Yes   Plan:  -Continue heparin 1000 units/h -Daily heparin level and CBC   66, PharmD, BCPS, [facility] Clinical Pharmacist [phone] Please  check AMION for all [facility] Pharmacy numbers 03/12/2021

## 2021-03-12 NOTE — Progress Notes (Signed)
PROGRESS NOTE    Denise Weiss  XBJ:[id number] DOB: Aug 05, 1951 DOA: 03/09/2021 PCP: [name], MD    Chief Complaint  Patient presents with  . Abdominal Pain    Brief Narrative:   Denise Weiss is a 70 y.o. female with medical history significant of scleroderma; stage 3 CKD; hypothyroidism; and HTN presenting with LUQ pain. CT angiogram showed Very large pulmonary embolus including central and peripheral saddle emboli. Involvement of all lobes. Clot burden is high. Right ventricular left ventricular ratio is abnormally elevated at 1.0, indicating right heart strain. Acute DVT in the left common femoral vein and superficial femoral vein.Peripheral airspace opacity in the left lower lobe compatible with pulmonary hemorrhage and/or infarct. Trace left pleural effusion. He was started on IV heparin and referred to [facility] for admission.  Pt seen and examined at bedside. She is breathing better.    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  Echocardiogram ordered , showed left ventricle has normal function. The left ventricle has no regional  wall motion abnormalities. Left ventricular diastolic parameters were  normal. The right ventricular size is normal. There is mildly elevated  pulmonary artery systolic pressure. The estimated right ventricular  systolic pressure is 37.1 mmHg. .  In view of her hemodynamic stability, oxygenation normal on room air she was started on IV heparin.  In view of her high clot burden we will continue with IV heparin for 72 hours and transition to DOAC on discharge. Plan to transition to eliquis tomorrow.   Will need  outpatient follow-up with PCP to check for underlying malignancy given imaging results and pericardial lymph nodal enlargement.     Breast abnormality on CT Her last mammogram was in the 40s.  She was recommended to follow-up with a mammogram as soon as possible.    Scleroderma/Raynaud's phenomenon Stable.    Hypertension Well controlled BP parameters.    Mild acute on  stage IIIa CKD Creatinine appears to have improved with IV fluids.   Creatinine around 1.1 and stable.     Hypothyroidism Continue with Synthroid.   Hyperlipidemia Continue with the Lipitor.  Leukocytosis;  Probably reactive, resolved.   DVT prophylaxis: heparin.  Code Status: DNR Family Communication: None at bedside disposition:   Status is: Inpatient  Remains inpatient appropriate because:Ongoing diagnostic testing needed not appropriate for outpatient work up and IV treatments appropriate due to intensity of illness or inability to take PO   Dispo: The patient is from: Home              Anticipated d/c is to: Home              Patient currently is not medically stable to d/c.   Difficult to place patient No       Consultants:   None.    Procedures: echocardiogram.    Antimicrobials: none.    Subjective: Chest pain improving. No sob. Remains on RA.   Objective: Vitals:   03/12/21 0507 03/12/21 0814 03/12/21 1200 03/12/21 1616  BP: (!) 117/56 (!) 126/58 118/64   Pulse:  64 66   Resp:  16 16 16   Temp:  98.3 F (36.8 C)  98.4 F (36.9 C) 98.3 F (36.8 C)  TempSrc: Oral  Oral Oral  SpO2: 95% 97% 94%   Weight:      Height:        Intake/Output Summary (Last 24 hours) at 03/12/2021 1822 Last data filed at 03/12/2021 1617 Gross per 24 hour  Intake 2465.13 ml  Output 2000 ml  Net 465.13 ml   Filed Weights   03/09/21 2240 03/10/21 1347  Weight: 70.3 kg 70.1 kg    Examination:  General exam: Alert and comfortable, on RA.  Respiratory system:  Air entry fair. No  wheezing heard.  Cardiovascular system: S1S2  RRR no JVD Gastrointestinal system: Abdomen is soft non tender non distended, bowel sounds wnl.  Central nervous system: alert and oriented, non focal.  Extremities: no pedal edema.  Skin: No rashes seen.  Psychiatry: Mood is appropriate.     Data Reviewed: I have personally reviewed following labs and imaging studies  CBC: Recent Labs  Lab 03/09/21 2301 03/11/21 0343 03/12/21 0104  WBC 12.8* 9.4 6.9  NEUTROABS 10.1*  --   --   HGB 14.0 12.7 11.5*  HCT 42.4 38.6 35.2*  MCV 94.6 94.4 93.9  PLT 196 180 190    Basic Metabolic Panel: Recent Labs  Lab 03/09/21 2301 03/11/21 0343 03/12/21 0930  NA 136 140 141  K 4.0 5.1 4.4  CL 102 108 107  CO2 27 24 29   GLUCOSE 119* 82 102*  BUN 12 11 10   CREATININE 1.20* 1.12* 1.19*  CALCIUM 8.9 8.7* 8.5*    GFR: Estimated Creatinine Clearance: 42.3 mL/min (A) (by C-G formula based on SCr of 1.19 mg/dL (H)).  Liver Function Tests: Recent Labs  Lab 03/09/21 2301  AST 18  ALT 12  ALKPHOS 94  BILITOT 2.5*  PROT 6.4*  ALBUMIN 3.5    CBG: No results for input(s): GLUCAP in the last 168 hours.   Recent Results (from the past 240 hour(s))  Urine culture     Status: Abnormal   Collection Time: 03/10/21  6:29 AM   Specimen: Urine, Random  Result Value Ref Range Status   Specimen Description URINE, RANDOM  Final   Special Requests   Final    NONE Performed at [facility] Lab, [address], [place], [address] [place]    Culture MULTIPLE SPECIES PRESENT, SUGGEST RECOLLECTION (A)  Final   Report Status 03/11/2021 FINAL  Final  Resp Panel by RT-PCR (Flu A&B, Covid) Nasopharyngeal Swab     Status: None   Collection Time: 03/10/21  1:11 PM   Specimen: Nasopharyngeal Swab; Nasopharyngeal(NP) swabs in vial transport medium  Result Value Ref Range Status   SARS Coronavirus 2 by RT PCR NEGATIVE NEGATIVE Final    Comment: (NOTE) SARS-CoV-2 target nucleic acids are NOT  DETECTED.  The SARS-CoV-2 RNA is generally detectable in upper respiratory specimens during the acute phase of infection. The lowest concentration of SARS-CoV-2 viral copies this assay can detect is 138 copies/mL. A negative result does not preclude SARS-Cov-2 infection and should not be used as the sole basis for treatment or other patient management decisions. A negative result may occur with  improper specimen collection/handling, submission of specimen other than nasopharyngeal swab, presence of viral mutation(s) within the areas targeted by this assay, and inadequate number of viral copies(<138 copies/mL). A negative result must be combined with clinical observations, patient history, and epidemiological information. The expected result is Negative.  Fact Sheet for Patients:  BloggerCourse.com  Fact Sheet for Healthcare Providers:  SeriousBroker.it  This test is no t yet approved or cleared by the [place] FDA and  has been authorized for detection and/or diagnosis of SARS-CoV-2 by FDA under an Emergency Use Authorization (EUA). This EUA will remain  in effect (meaning this test can be used) for the duration of the COVID-19 declaration under Section 564(b)(1) of the Act, 21 U.S.C.section 360bbb-3(b)(1), unless the authorization is terminated  or revoked sooner.       Influenza A by PCR NEGATIVE NEGATIVE Final   Influenza B by PCR NEGATIVE NEGATIVE Final    Comment: (NOTE) The Xpert Xpress SARS-CoV-2/FLU/RSV plus assay is intended as an aid in the diagnosis of influenza from Nasopharyngeal swab specimens and should not be used as a sole basis for treatment. Nasal washings and aspirates are unacceptable for Xpert Xpress SARS-CoV-2/FLU/RSV testing.  Fact Sheet for Patients: BloggerCourse.com  Fact Sheet for Healthcare Providers: SeriousBroker.it  This test is not yet  approved or cleared by the [place] FDA and has been authorized for detection and/or diagnosis of SARS-CoV-2 by FDA under an Emergency Use Authorization (EUA). This EUA will remain in effect (meaning this test can be used) for the duration of the COVID-19 declaration under Section 564(b)(1) of the Act, 21 U.S.C. section 360bbb-3(b)(1), unless the authorization is terminated or revoked.  Performed at [facility] Lab, [address] [place], [place] [address]          Radiology Studies: No results found.      Scheduled Meds: . (feeding supplement) PROSource Plus  30 mL Oral Daily  . ARIPiprazole  5 mg Oral QHS  . atenolol  50 mg Oral BID  . atorvastatin  20 mg Oral q1800  . docusate sodium  100 mg Oral BID  . levothyroxine  88 mcg Oral q morning  . multivitamin with minerals  1 tablet Oral Daily  . pantoprazole  40 mg Oral Daily  . sodium chloride flush  3 mL Intravenous Q12H  . traMADol  100 mg Oral Q12H  . vortioxetine HBr  5 mg Oral Daily   Continuous Infusions: . heparin 1,000 Units/hr (03/12/21 1617)  . lactated ringers 75 mL/hr at 03/12/21 1617     LOS: 2 days        Kathlen Mody, MD [facility]   To contact the attending provider between 7A-7P or the covering provider during after hours 7P-7A, please log into the web site [email or web address] and access using universal [place] password for that web site. If you do not have the password, please call the hospital operator.  03/12/2021, 6:22 PM

## 2021-03-12 NOTE — Progress Notes (Signed)
ANTICOAGULATION CONSULT NOTE - Follow Up Consult  Pharmacy Consult for Heparin Indication: pulmonary embolus and DVT  No Known Allergies  Patient Measurements: Height: 5\' 4"  (162.6 cm) Weight: 70.1 kg (154 lb 8.7 oz) IBW/kg (Calculated) : 54.7 kg Heparin Dosing Weight: 69 kg  Vital Signs: Temp: 98.3 F (36.8 C) (05/08 0507) Temp Source: Oral (05/08 0507) BP: 117/56 (05/08 0507)  Labs: Recent Labs    03/09/21 2301 03/10/21 0117 03/10/21 1642 03/11/21 0343 03/11/21 1121 03/11/21 1913 03/12/21 0104  HGB 14.0  --   --  12.7  --   --  11.5*  HCT 42.4  --   --  38.6  --   --  35.2*  PLT 196  --   --  180  --   --  190  HEPARINUNFRC  --   --    < > 0.78* 0.41 0.38 0.35  CREATININE 1.20*  --   --  1.12*  --   --   --   TROPONINIHS 6 5  --   --   --   --   --    < > = values in this interval not displayed.    Estimated Creatinine Clearance: 44.9 mL/min (A) (by C-G formula based on SCr of 1.12 mg/dL (H)).   Medical History: Past Medical History:  Diagnosis Date  . Arthritis   . Fibromyalgia   . Hypercholesteremia   . Scleroderma (HCC)   . Stage 3a chronic kidney disease (HCC)     Medications:  Infusions:  . heparin 950 Units/hr (03/11/21 2053)  . lactated ringers 75 mL/hr at 03/12/21 0013    Assessment: 70 yo female presented with with LUQ pain and a CTA showing a large saddle PE with RHS and an acute DVT in the left common femoral vein and superficial femoral vein. PTA the patient is not on anticoagulation. Pharmacy is consulted to dose heparin.  Heparin level is therapeutic at 0.35 while running at 950 units/hr. The heparin level drifted down slightly despite an increase in the rate by 50 units/hr. Therefore, will increase rate again slightly to prevent her from going subtherapeutic. Per the RN, there have been no issues with the infusion and the patient is without signs or symptoms of bleeding. Hgb 11.5, platelets 190.   Goal of Therapy:  Heparin level 0.3-0.7  units/ml Monitor platelets by anticoagulation protocol: Yes   Plan:  -Increase heparin IV to 1000 units/hr -Obtain a confirmatory 8-hr heparin level -Obtain a daily heparin level and CBC  -Monitor for signs and symptoms of bleeding  Thank you for allowing pharmacy to be a part of this patient's care.   66, PharmD, RPh  PGY-1 Pharmacy Resident 03/12/2021 8:14 AM  Please check [email or web address] for unit-specific pharmacy phone numbers.

## 2021-03-13 ENCOUNTER — Other Ambulatory Visit (HOSPITAL_COMMUNITY): Payer: Self-pay

## 2021-03-13 LAB — CBC
HCT: 34.2 % — ABNORMAL LOW (ref 36.0–46.0)
Hemoglobin: 11.3 g/dL — ABNORMAL LOW (ref 12.0–15.0)
MCH: 31.2 pg (ref 26.0–34.0)
MCHC: 33 g/dL (ref 30.0–36.0)
MCV: 94.5 fL (ref 80.0–100.0)
Platelets: 205 10*3/uL (ref 150–400)
RBC: 3.62 MIL/uL — ABNORMAL LOW (ref 3.87–5.11)
RDW: 12.4 % (ref 11.5–15.5)
WBC: 6.5 10*3/uL (ref 4.0–10.5)
nRBC: 0 % (ref 0.0–0.2)

## 2021-03-13 LAB — BASIC METABOLIC PANEL
Anion gap: 5 (ref 5–15)
BUN: 10 mg/dL (ref 8–23)
CO2: 27 mmol/L (ref 22–32)
Calcium: 8.5 mg/dL — ABNORMAL LOW (ref 8.9–10.3)
Chloride: 108 mmol/L (ref 98–111)
Creatinine, Ser: 1.1 mg/dL — ABNORMAL HIGH (ref 0.44–1.00)
GFR, Estimated: 54 mL/min — ABNORMAL LOW (ref >60)
Glucose, Bld: 90 mg/dL (ref 70–99)
Potassium: 4.5 mmol/L (ref 3.5–5.1)
Sodium: 140 mmol/L (ref 135–145)

## 2021-03-13 LAB — HEPARIN LEVEL (UNFRACTIONATED): Heparin Unfractionated: 0.46 IU/mL (ref 0.30–0.70)

## 2021-03-13 MED ORDER — APIXABAN 5 MG PO TABS
10.0000 mg | ORAL_TABLET | Freq: Two times a day (BID) | ORAL | Status: DC
  Administered 2021-03-13: 10 mg via ORAL
  Filled 2021-03-13: qty 2

## 2021-03-13 MED ORDER — PROSOURCE PLUS PO LIQD: 30.0000 mL | Freq: Every day | ORAL | Status: DC

## 2021-03-13 MED ORDER — APIXABAN 5 MG PO TABS: 5.0000 mg | ORAL_TABLET | Freq: Two times a day (BID) | ORAL | Status: DC

## 2021-03-13 MED ORDER — DOCUSATE SODIUM 100 MG PO CAPS: 100.0000 mg | ORAL_CAPSULE | Freq: Two times a day (BID) | ORAL | 0 refills | Status: DC

## 2021-03-13 MED ORDER — APIXABAN 5 MG PO TABS: 10.0000 mg | ORAL_TABLET | Freq: Two times a day (BID) | ORAL | 0 refills | Status: DC

## 2021-03-13 MED ORDER — ADULT MULTIVITAMIN W/MINERALS CH: 1.0000 | ORAL_TABLET | Freq: Every day | ORAL | Status: AC

## 2021-03-13 MED ORDER — APIXABAN 5 MG PO TABS: 5.0000 mg | ORAL_TABLET | Freq: Two times a day (BID) | ORAL | 1 refills | Status: DC

## 2021-03-13 NOTE — Evaluation (Signed)
Physical Therapy Evaluation Patient Details Name: Denise Weiss MRN: [id number]  DOB: 03-20-1951 Today's Date: 03/13/2021   History of Present Illness  Pt is a 70 y/o female admitted 5/5 secondary to worsening L upper quadrant pain. Found to have saddle PE and was started on heparin. Pt also found to have breast abnormality on CT. PMH includes L leg length discrepancy, CKD, HTN, and scleroderma.  Clinical Impression  Pt admitted secondary to problem above with deficits below. Pt requiring min guard A for mobility tasks using HHA. Educated about using cane more often for support. 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 She lives with the patient to provide supervision, but does not have to provide physical help. Anticipate pt will progress well and not require follow up. Will continue to follow acutely.     Follow Up Recommendations No PT follow up    Equipment Recommendations  3in1 (PT)    Recommendations for Other Services       Precautions / Restrictions Precautions Precautions: Fall Restrictions Weight Bearing Restrictions: No      Mobility  Bed Mobility Overal bed mobility: Needs Assistance Bed Mobility: Supine to Sit;Sit to Supine     Supine to sit: Supervision Sit to supine: Min assist   General bed mobility comments: Min A for LE assist to return to supine.    Transfers Overall transfer level: Needs assistance Equipment used: 1 person hand held assist Transfers: Sit to/from Stand Sit to Stand: Min guard         General transfer comment: Min guard for safety. Mild instability upon standing, but no overt LOB.  Ambulation/Gait Ambulation/Gait assistance: Min guard Gait Distance (Feet): 120 Feet Assistive device: 1 person hand held assist Gait Pattern/deviations: Step-to pattern;Decreased weight shift to left Gait velocity: Decreased   General Gait Details: Pt with L leg length discrepancy at baseline. Walked on tip toes of L foot. HHA to  simulate use of cane. Min guard for safety. No LOB noted.  Stairs            Wheelchair Mobility    Modified Rankin (Stroke Patients Only)       Balance Overall balance assessment: Mild deficits observed, not formally tested                                           Pertinent Vitals/Pain Pain Assessment: No/denies pain    Home Living Family/patient expects to be discharged to:: Private residence Living Arrangements: Other (Comment) (lives with her patient whom she is a caregiver for)   Type of Home: House Home Access: Stairs to enter Entrance Stairs-Rails: Doctor, general practice of Steps: 2 Home Layout: One level Home Equipment: Grab bars - tub/shower Additional Comments: Is a caregiver for a dementia patient. REports she lives with him to provide supervision and cooks meals. Does not have to provide physical assist.    Prior Function Level of Independence: Independent with assistive device(s)         Comments: Uses cane occasionally secondary to "bad hip" on the L.     Hand Dominance        Extremity/Trunk Assessment   Upper Extremity Assessment Upper Extremity Assessment: Overall WFL for tasks assessed    Lower Extremity Assessment Lower Extremity Assessment: LLE deficits/detail LLE Deficits / Details: L leg length discrepancy at baseline.    Cervical / Trunk Assessment  Cervical / Trunk Assessment: Normal  Communication   Communication: No difficulties  Cognition Arousal/Alertness: Awake/alert Behavior During Therapy: WFL for tasks assessed/performed Overall Cognitive Status: Within Functional Limits for tasks assessed                                        General Comments      Exercises     Assessment/Plan    PT Assessment Patient needs continued PT services  PT Problem List Decreased strength;Decreased mobility       PT Treatment Interventions DME instruction;Gait training;Stair  training;Functional mobility training;Therapeutic activities;Therapeutic exercise;Balance training;Patient/family education    PT Goals (Current goals can be found in the Care Plan section)  Acute Rehab PT Goals Patient Stated Goal: to go home PT Goal Formulation: With patient Time For Goal Achievement: 03/27/21 Potential to Achieve Goals: Good    Frequency Min 3X/week   Barriers to discharge        Co-evaluation               AM-PAC PT "6 Clicks" Mobility  Outcome Measure Help needed turning from your back to your side while in a flat bed without using bedrails?: None Help needed moving from lying on your back to sitting on the side of a flat bed without using bedrails?: None Help needed moving to and from a bed to a chair (including a wheelchair)?: A Little Help needed standing up from a chair using your arms (e.g., wheelchair or bedside chair)?: A Little Help needed to walk in hospital room?: A Little Help needed climbing 3-5 steps with a railing? : None 6 Click Score: 21    End of Session   Activity Tolerance: Patient tolerated treatment well Patient left: in bed;with call bell/phone within reach;with bed alarm set Nurse Communication: Mobility status PT Visit Diagnosis: Other abnormalities of gait and mobility (R26.89)    Time: 2878-6767 PT Time Calculation (min) (ACUTE ONLY): 21 min   Charges:   PT Evaluation $PT Eval Low Complexity: 1 Low          Cindee Salt, DPT  Acute Rehabilitation Services  Pager: [phone] Office: [phone]   [facility] Prom 03/13/2021, 9:43 AM

## 2021-03-13 NOTE — Discharge Instructions (Addendum)
Pulmonary Embolism  A pulmonary embolism (PE) is a sudden blockage or decrease of blood flow in one or both lungs that happens when a clot travels into the arteries of the lung (pulmonary arteries). Most blockages come from a blood clot that forms in the vein of a leg or arm (deep vein thrombosis, DVT) and travels to the lungs. A clot is blood that has thickened into a gel or solid. PE is a dangerous and life-threatening condition that needs to be treated right away. What are the causes? This condition is usually caused by a blood clot that forms in a vein and moves to the lungs. In rare cases, it may be caused by air, fat, part of a tumor, or other tissue that moves through the veins and into the lungs. What increases the risk? The following factors may make you more likely to develop this condition:  Experiencing a traumatic injury, such as breaking a hip or leg.  Having: ? A spinal cord injury. ? Major surgery, especially hip or knee replacement, or surgery on parts of the nervous system or on the abdomen. ? A stroke. ? A blood clotting disease. ? Long-term (chronic) lung or heart disease. ? Cancer, especially if you are being treated with chemotherapy. ? A central venous catheter.  Taking medicines that contain estrogen. These include birth control pills and hormone replacement therapy.  Being: ? Pregnant. ? In the period of time after your baby is delivered (postpartum). ? Older than age 57. ? Overweight. ? A smoker, especially if you have other risks. ? Not very active (sedentary), not being able to move at all, or spending long periods sitting, such as travel over 6 hours. You are also at a greater risk if you have a leg in a cast or splint. What are the signs or symptoms? Symptoms of this condition usually start suddenly and include:  Shortness of breath during activity or at rest.  Coughing, coughing up blood, or coughing up bloody mucus.  Chest pain, back pain, or  shoulder blade pain that gets worse with deep breaths.  Rapid or irregular heartbeat.  Feeling light-headed or dizzy, or fainting.  Feeling anxious.  Pain and swelling in a leg. This is a symptom of DVT, which can lead to PE. How is this diagnosed? This condition may be diagnosed based on your medical history, a physical exam, and tests. Tests may include:  Blood tests.  An ECG (electrocardiogram) of the heart.  A CT pulmonary angiogram. This test checks blood flow in and around your lungs.  A ventilation-perfusion scan, also called a lung VQ scan. This test measures air flow and blood flow to the lungs.  An ultrasound to check for a DVT. How is this treated? Treatment for this condition depends on many factors, such as the cause of your PE, your risk for bleeding or developing more clots, and other medical conditions you may have. Treatment aims to stop blood clots from forming or growing larger. In some cases, treatment may be aimed at breaking apart or removing the blood clot. Treatment may include:  Medicines, such as: ? Blood thinning medicines, also called anticoagulants, to stop clots from forming and growing. ? Medicines that break apart clots (thrombolytics).  Procedures, such as: ? Using a flexible tube to remove a blood clot (embolectomy) or to deliver medicine to destroy it (catheter-directed thrombolysis). ? Surgery to remove the clot (surgical embolectomy). This is rare. You may need a combination of immediate, long-term, and  extended treatments. Your treatment may continue for several months (maintenance therapy) or longer depending on your medical conditions. You and your health care provider will work together to choose the treatment program that is best for you. Follow these instructions at home: Medicines  Take over-the-counter and prescription medicines only as told by your health care provider.  If you are taking blood thinners: ? Talk with your health care  provider before you take any medicines that contain aspirin or NSAIDs, such as ibuprofen. These medicines increase your risk for dangerous bleeding. ? Take your medicine exactly as told, at the same time every day. ? Avoid activities that could cause injury or bruising, and follow instructions about how to prevent falls. ? Wear a medical alert bracelet or carry a card that lists what medicines you take.  Understand what foods and drugs interact with any medicines that you are taking. General instructions  Ask your health care provider when you may return to your normal activities. Avoid sitting or lying for a long time without moving.  Maintain a healthy weight. Ask your health care provider what weight is healthy for you.  Do not use any products that contain nicotine or tobacco, such as cigarettes, e-cigarettes, and chewing tobacco. If you need help quitting, ask your health care provider.  Talk with your health care provider about any travel plans. It is important to make sure that you are still able to take your medicine while traveling.  Keep all follow-up visits as told by your health care provider. This is important. Where to find more information  American Lung Association: www.lung.org  Centers for Disease Control and Prevention: http://www.wolf.info/ Contact a health care provider if:  You missed a dose of your blood thinner medicine. Get help right away if you:  Have: ? New or increased pain, swelling, warmth, or redness in an arm or leg. ? Shortness of breath that gets worse during activity or at rest. ? A fever. ? Worsening chest pain. ? A rapid or irregular heartbeat. ? A severe headache. ? Vision changes. ? A serious fall or accident, or you hit your head. ? Stomach pain. ? Blood in your vomit, stool, or urine. ? A cut that will not stop bleeding.  Cough up blood.  Feel light-headed or dizzy, and that feeling does not go away.  Cannot move your arms or legs.  Are  confused or have memory loss. These symptoms may represent a serious problem that is an emergency. Do not wait to see if the symptoms will go away. Get medical help right away. Call your local emergency services (911 in the [place] Do not drive yourself to the hospital. Summary  A pulmonary embolism (PE) is a serious and potentially life-threatening condition, in which a blood clot from one part of the body (deep vein thrombosis, DVT) travels to the arteries of the lung, causing a sudden blockage or decrease of blood flow to the lungs. This may result in shortness of breath, chest pain, dizziness, and fainting.  Treatments for this condition usually include medicines to thin your blood (anticoagulants) or medicines to break apart blood clots (thrombolytics).  If you are given blood thinners, take your medicine exactly as told by your health care provider, at the same time every day. This is important.  Understand what foods and drugs interact with any medicines that you are taking.  If you have signs of PE or DVT, call your local emergency services (911 in the [place] This information is  not intended to replace advice given to you by your health care provider. Make sure you discuss any questions you have with your health care provider. Document Revised: 09/04/2019 Document Reviewed: 09/04/2019 Elsevier Patient Education  2021 [employer] Information on my medicine - ELIQUIS (apixaban)  Why was Eliquis prescribed for you? Eliquis was prescribed to treat blood clots that may have been found in the veins of your legs (deep vein thrombosis) or in your lungs (pulmonary embolism) and to reduce the risk of them occurring again.  What do You need to know about Eliquis ? The starting dose is 10 mg (two 5 mg tablets) taken TWICE daily for the FIRST SEVEN (7) DAYS, then on 03/20/21  the dose is reduced to ONE 5 mg tablet taken TWICE daily.  Eliquis may be taken with or without food.   Try to take  the dose about the same time in the morning and in the evening. If you have difficulty swallowing the tablet whole please discuss with your pharmacist how to take the medication safely.  Take Eliquis exactly as prescribed and DO NOT stop taking Eliquis without talking to the doctor who prescribed the medication.  Stopping may increase your risk of developing a new blood clot.  Refill your prescription before you run out.  After discharge, you should have regular check-up appointments with your healthcare provider that is prescribing your Eliquis.    What do you do if you miss a dose? If a dose of ELIQUIS is not taken at the scheduled time, take it as soon as possible on the same day and twice-daily administration should be resumed. The dose should not be doubled to make up for a missed dose.  Important Safety Information A possible side effect of Eliquis is bleeding. You should call your healthcare provider right away if you experience any of the following: ? Bleeding from an injury or your nose that does not stop. ? Unusual colored urine (red or dark brown) or unusual colored stools (red or black). ? Unusual bruising for unknown reasons. ? A serious fall or if you hit your head (even if there is no bleeding).  Some medicines may interact with Eliquis and might increase your risk of bleeding or clotting while on Eliquis. To help avoid this, consult your healthcare provider or pharmacist prior to using any new prescription or non-prescription medications, including herbals, vitamins, non-steroidal anti-inflammatory drugs (NSAIDs) and supplements.  This website has more information on Eliquis (apixaban): http://www.eliquis.com/eliquis/home

## 2021-03-13 NOTE — TOC Benefit Eligibility Note (Addendum)
Patient Product/process development scientist completed.    The patient is currently admitted and upon discharge could be taking Eliquis 5 mg.  The current 30 day co-pay is, $130.26.   The patient is currently admitted and upon discharge could be taking Xarelto 20 mg.  The current 30 day co-pay is, $127.21.   The patient is insured through [employer] Medicare Part D     Denise Weiss, CPhT Pharmacy Patient Advocate Specialist [facility] Antimicrobial Stewardship Team Direct Number: [phone]  Fax: [phone]

## 2021-03-13 NOTE — TOC Progression Note (Signed)
Transition of Care [facility]) - Progression Note    Patient Details  Name: Denise Weiss MRN: [id number] Date of Birth: [birth year]  Transition of [facility]) CM/SW Contact  Leone Haven, RN Phone Number: 03/13/2021, 3:33 PM  Clinical Narrative:    Patient is for dc today, NCM called patient room to discuss eliquis, no answer.  NCM contacted Staff RN, she states patient was just taken down , NCM asked if she was given the eliquis coupon.  She states no does she need one.  NCM informed her that she does, Engineer, drilling states she will catch her and give it to her.          Expected Discharge Plan and Services           Expected Discharge Date: 03/13/21                                     Social Determinants of Health (SDOH) Interventions    Readmission Risk Interventions No flowsheet data found.

## 2021-03-13 NOTE — Progress Notes (Signed)
ANTICOAGULATION CONSULT NOTE - Follow Up Consult  Pharmacy Consult for Heparin Indication: pulmonary embolus and DVT  No Known Allergies  Patient Measurements: Height: 5\' 4"  (162.6 cm) Weight: 72.1 kg (158 lb 15.2 oz) IBW/kg (Calculated) : 54.7 kg Heparin Dosing Weight: 69 kg  Vital Signs: Temp: 98.3 F (36.8 C) (05/09 0807) Temp Source: Oral (05/09 0807) BP: 111/54 (05/09 0807) Pulse Rate: 69 (05/09 0807)  Labs: Recent Labs    03/11/21 0343 03/11/21 1121 03/12/21 0104 03/12/21 0930 03/12/21 1648 03/13/21 0113  HGB 12.7  --  11.5*  --   --  11.3*  HCT 38.6  --  35.2*  --   --  34.2*  PLT 180  --  190  --   --  205  HEPARINUNFRC 0.78*   < > 0.35  --  0.48 0.46  CREATININE 1.12*  --   --  1.19*  --  1.10*   < > = values in this interval not displayed.    Estimated Creatinine Clearance: 46.4 mL/min (A) (by C-G formula based on SCr of 1.1 mg/dL (H)).   Medical History: Past Medical History:  Diagnosis Date  . Arthritis   . Fibromyalgia   . Hypercholesteremia   . Scleroderma (HCC)   . Stage 3a chronic kidney disease (HCC)     Medications:  Infusions:  . heparin 1,000 Units/hr (03/13/21 0807)  . lactated ringers 75 mL/hr at 03/13/21 05/13/21    Assessment: 70 yo female presented with with LUQ pain and a CTA showing a large saddle PE with RHS and an acute DVT in the left common femoral vein and superficial femoral vein. PTA the patient is not on anticoagulation. Pharmacy was consulted to dose heparin and now to transition to apixaban. Apixaban cost is about $130 per month.  She is aware and willing to pay the cost and prefers apixaban over warfarin.   Goal of Therapy:  Heparin level 0.3-0.7 units/ml Monitor platelets by anticoagulation protocol: Yes   Plan:  -apixaban 10mg  po bid for 7 days then 5mg  po bid -Education provided  70, PharmD Clinical Pharmacist **Pharmacist phone directory can now be found on [email or web address] ([email or web address]).  Listed under [facility]  Pharmacy.

## 2021-03-15 DIAGNOSIS — N390 Urinary tract infection, site not specified: Secondary | ICD-10-CM | POA: Diagnosis not present

## 2021-03-15 DIAGNOSIS — M797 Fibromyalgia: Secondary | ICD-10-CM | POA: Diagnosis not present

## 2021-03-15 DIAGNOSIS — F3181 Bipolar II disorder: Secondary | ICD-10-CM | POA: Diagnosis not present

## 2021-03-15 DIAGNOSIS — N63 Unspecified lump in unspecified breast: Secondary | ICD-10-CM | POA: Diagnosis not present

## 2021-03-15 DIAGNOSIS — Z09 Encounter for follow-up examination after completed treatment for conditions other than malignant neoplasm: Secondary | ICD-10-CM | POA: Diagnosis not present

## 2021-03-15 DIAGNOSIS — L940 Localized scleroderma [morphea]: Secondary | ICD-10-CM | POA: Diagnosis not present

## 2021-03-15 DIAGNOSIS — I2602 Saddle embolus of pulmonary artery with acute cor pulmonale: Secondary | ICD-10-CM | POA: Diagnosis not present

## 2021-03-15 DIAGNOSIS — N183 Chronic kidney disease, stage 3 unspecified: Secondary | ICD-10-CM | POA: Diagnosis not present

## 2021-03-15 DIAGNOSIS — Z8349 Family history of other endocrine, nutritional and metabolic diseases: Secondary | ICD-10-CM | POA: Diagnosis not present

## 2021-03-20 ENCOUNTER — Other Ambulatory Visit: Payer: Self-pay | Admitting: Family Medicine

## 2021-03-20 DIAGNOSIS — N63 Unspecified lump in unspecified breast: Secondary | ICD-10-CM

## 2021-03-22 ENCOUNTER — Other Ambulatory Visit: Payer: Self-pay | Admitting: Family Medicine

## 2021-03-22 DIAGNOSIS — N63 Unspecified lump in unspecified breast: Secondary | ICD-10-CM

## 2021-03-22 NOTE — Discharge Summary (Signed)
Physician Discharge Summary  Denise Weiss DQQ:[id number] DOB: June 30, 1951 DOA: 03/09/2021  PCP: [name], MD  Admit date: 03/09/2021 Discharge date: 03/13/2021  Admitted From: home.  Disposition:  Home.   Recommendations for Outpatient Follow-up:  1. Follow up with PCP in 1-2 weeks 2. Please obtain BMP/CBC in one week   Discharge Condition:guarded.  CODE STATUS:FULL CODE.  Diet recommendation: Heart Healthy   Brief/Interim Summary: Denise Meininger Nehlsenis a 70 y.o.femalewith medical history significant ofscleroderma; stage 3 CKD; hypothyroidism; and HTN presenting with LUQ pain. CT angiogram showed Very large pulmonary embolus including central and peripheral saddle emboli. Involvement of all lobes. Clot burden is high. Right ventricular left ventricular ratio is abnormally elevated at 1.0, indicating right heart strain. Acute DVT in the left common femoral vein and superficial femoral vein.Peripheral airspace opacity in the left lower lobe compatible with pulmonary hemorrhage and/or infarct. Trace left pleural effusion. He was started on IV heparin and referred to [facility] for admission.  She was started on IV heparin and transitioned to eliquis for discharge.   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  Echocardiogram ordered , showed left ventricle has normal function. The left ventricle has no regional  wall motion abnormalities. Left ventricular diastolic parameters were  normal. The right ventricular size is normal. There is mildly elevated  pulmonary artery systolic pressure. The estimated right ventricular  systolic pressure is 37.1 mmHg. .  In  view of her hemodynamic stability, oxygenation normal on room air she was started on IV heparin.  In view of her high clot burden we will continue with IV heparin for 72 hours and transition to DOAC on discharge. Transitioned to eliquis on discharge.   Will need outpatient follow-up with PCP to check for underlying malignancy given imaging results and pericardial lymph nodal enlargement.     Breast abnormality on CT Her last mammogram was in the 71s.  She was recommended to follow-up with a mammogram as soon as possible.    Scleroderma/Raynaud's phenomenon Stable.    Hypertension Well controlled BP parameters.    Mild acute on  stage IIIa CKD Creatinine appears to have improved with IV fluids.   Creatinine around 1.1 and stable.     Hypothyroidism Continue with Synthroid.   Hyperlipidemia Continue with the Lipitor.  Leukocytosis;  Probably reactive, resolved.    Discharge Instructions  Discharge Instructions    Diet - low sodium heart healthy   Complete by: As directed    Discharge instructions   Complete by: As directed    Follow up with PCP in one week.   Increase activity slowly   Complete by: As directed      Allergies as of 03/13/2021   No Known Allergies     Medication List    TAKE these medications   (feeding supplement) PROSource Plus liquid Take 30 mLs by mouth daily.   ALPRAZolam 0.25 MG tablet Commonly known as: XANAX Take 0.25-0.5 mg by mouth daily as needed for sleep.   apixaban 5 MG Tabs tablet Commonly known as: ELIQUIS Take 2 tablets (10 mg total) by mouth 2 (two) times daily for 7 days.   apixaban  5 MG Tabs tablet Commonly known as: ELIQUIS Take 1 tablet (5 mg total) by mouth 2 (two) times daily. Start on 03/21/2021 after you finish the 10 mg dose on 03/20/21   ARIPiprazole 5 MG tablet Commonly known as: ABILIFY Take 5 mg by mouth at bedtime.   atenolol 50 MG tablet Commonly known as: TENORMIN Take 50 mg by  mouth 2 (two) times daily.   atorvastatin 20 MG tablet Commonly known as: LIPITOR Take 20 mg by mouth daily.   docusate sodium 100 MG capsule Commonly known as: COLACE Take 1 capsule (100 mg total) by mouth 2 (two) times daily.   levothyroxine 88 MCG tablet Commonly known as: SYNTHROID Take 88 mcg by mouth every morning.   losartan 25 MG tablet Commonly known as: COZAAR Take 50 mg by mouth daily.   multivitamin with minerals Tabs tablet Take 1 tablet by mouth daily.   omeprazole 20 MG capsule Commonly known as: PRILOSEC Take 20 mg by mouth daily.   traMADol 50 MG tablet Commonly known as: ULTRAM Take 100 mg by mouth every 12 (twelve) hours.   Trintellix 5 MG Tabs tablet Generic drug: vortioxetine HBr Take 5 mg by mouth daily.       Follow-up Information    [name], MD. Schedule an appointment as soon as possible for a visit in 1 week(s).   Specialty: Family Medicine Contact information: [address] [place] [address] [phone]              No Known Allergies  Consultations: NONE.   Procedures/Studies: DG Chest 2 View  Result Date: 03/09/2021 CLINICAL DATA:  Left chest pain, left upper quadrant pain for 2 days EXAM: CHEST - 2 VIEW COMPARISON:  None. FINDINGS: Dome-shaped, pleural based opacification in the periphery of the left lung base, could reflect acute airspace consolidation or possible Hampton's hump which can be seen in the setting of pulmonary artery embolus. No pneumothorax. No visible layering effusion. The cardiomediastinal contours are unremarkable. No acute osseous or soft tissue abnormality. Degenerative changes are present in the imaged spine and shoulders. IMPRESSION: Pleural-based opacification the periphery of the lung base, could reflect acute airspace disease/pneumonia or possible Hampton hump which can be seen in the setting of pulmonary embolus. Electronically Signed   By: Kreg Shropshire M.D.   On: 03/09/2021 23:22   CT  Angio Chest PE W and/or Wo Contrast  Result Date: 03/10/2021 CLINICAL DATA:  Abdominal abscess. Left chest pain. History of DVT. EXAM: CT ANGIOGRAPHY CHEST CT ABDOMEN AND PELVIS WITH CONTRAST TECHNIQUE: Multidetector CT imaging of the chest was performed using the standard protocol during bolus administration of intravenous contrast. Multiplanar CT image reconstructions and MIPs were obtained to evaluate the vascular anatomy. Multidetector CT imaging of the abdomen and pelvis was performed using the standard protocol during bolus administration of intravenous contrast. CONTRAST:  80mL OMNIPAQUE IOHEXOL 350 MG/ML SOLN COMPARISON:  Chest radiograph 03/09/2021 FINDINGS: CTA CHEST FINDINGS Cardiovascular: Large saddle embolus spanning between the right and left pulmonary arteries. Lobar pulmonary emboli in the right upper lobe, spanning between the right middle lobe and right lower lobe, and spanning between the left upper lobe and left lower lobe. Clot burden is high. Right ventricular to left ventricular ratio 1.0. Aortic arch and left anterior descending coronary artery atherosclerotic calcification. Mediastinum/Nodes: Suspected lymph node versus less likely pleural-based nodule along the left pericardial margin posteriorly measuring 1.0 by 1.3 cm on image 160 of series 4, just below the left pulmonary  vein. AP window lymph node 0.7 cm in short axis on image 92 series 4. Asymmetric nodularity in the right breast tissues compared to the left, follow up diagnostic mammographic assessment in the nonacute setting is recommended. Lungs/Pleura: Subpleural triangular nodularity in the right upper lobe 0.7 by 0.5 cm on image 33 series 5, probably from atelectasis or scarring given the triangular morphology. Peripheral airspace opacity in the left lower lobe on image 58 of series 5 may reflect pulmonary hemorrhage or infarct. Trace left pleural effusion. As noted above, there is nodularity below the left pulmonary vein which  may represent a pleural-based nodule or a pericardial lymph node, measuring 1.0 by 1.3 cm, likely meriting surveillance. Musculoskeletal: Mild thoracic spondylosis. Review of the MIP images confirms the above findings. CT ABDOMEN and PELVIS FINDINGS Hepatobiliary: Four hypodense lesions in the left hepatic lobe are likely cysts but technically too small to characterize. Gallbladder unremarkable. Pancreas: Unremarkable Spleen: Unremarkable Adrenals/Urinary Tract: Unremarkable Stomach/Bowel: Unremarkable Vascular/Lymphatic: Aortoiliac atherosclerotic vascular disease. Filling defect in the left common femoral vein and superficial femoral vein compatible with DVT. No pathologic adenopathy in the abdomen/pelvis. Reproductive: Unremarkable Other: No supplemental non-categorized findings. Musculoskeletal: Deformity of the left hip with suspected healed left hip fracture, and severe degenerative arthropathy and remodeling of the left hip compatible with chronic arthropathy. Moderate degenerative arthropathy of the right hip. Lower lumbar spondylosis and degenerative disc disease with grade 1 anterolisthesis at L4-5, and right foraminal impingement at the L4-5 level. Review of the MIP images confirms the above findings. IMPRESSION: 1. Very large pulmonary embolus including central and peripheral saddle emboli. Involvement of all lobes. Clot burden is high. Right ventricular left ventricular ratio is abnormally elevated at 1.0, indicating right heart strain. 2. Acute DVT in the left common femoral vein and superficial femoral vein. 3. Peripheral airspace opacity in the left lower lobe compatible with pulmonary hemorrhage and/or infarct. Trace left pleural effusion. 4. Nodularity in the right breast is asymmetric and warrants follow up diagnostic mammography in the nonacute setting. 5. 1.0 by 1.3 cm nodule below the left pulmonary vein is probably a borderline enlarged pericardial lymph node but could represent a peripheral  pulmonary nodule. If mammographic workup turns out negative than I would suggest either PET-CT or follow up CT in 3 months time to reassess this lesion. 6. 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 Right foraminal impingement at L4-5. Aortic Atherosclerosis (ICD10-I70.0). Coronary atherosclerosis. Hypodense left hepatic lobe lesions are likely benign but technically too small to characterize. Critical Value/emergent results were called by telephone at the time of interpretation on 03/10/2021 at 7:31 am to provider [facility] , who verbally acknowledged these results. Electronically Signed   By: Gaylyn Rong M.D.   On: 03/10/2021 07:41   CT ABDOMEN PELVIS W CONTRAST  Result Date: 03/10/2021 CLINICAL DATA:  Abdominal abscess. Left chest pain. History of DVT. EXAM: CT ANGIOGRAPHY CHEST CT ABDOMEN AND PELVIS WITH CONTRAST TECHNIQUE: Multidetector CT imaging of the chest was performed using the standard protocol during bolus administration of intravenous contrast. Multiplanar CT image reconstructions and MIPs were obtained to evaluate the vascular anatomy. Multidetector CT imaging of the abdomen and pelvis was performed using the standard protocol during bolus administration of intravenous contrast. CONTRAST:  4mL OMNIPAQUE IOHEXOL 350 MG/ML SOLN COMPARISON:  Chest radiograph 03/09/2021 FINDINGS: CTA CHEST FINDINGS Cardiovascular: Large saddle embolus spanning between the right and left pulmonary arteries. Lobar pulmonary emboli in the right upper lobe, spanning between  the right middle lobe and right lower lobe, and spanning between the left upper lobe and left lower lobe. Clot burden is high. Right ventricular to left ventricular ratio 1.0. Aortic arch and left anterior descending coronary artery atherosclerotic calcification. Mediastinum/Nodes: Suspected lymph node versus less likely pleural-based nodule along the left pericardial margin  posteriorly measuring 1.0 by 1.3 cm on image 160 of series 4, just below the left pulmonary vein. AP window lymph node 0.7 cm in short axis on image 92 series 4. Asymmetric nodularity in the right breast tissues compared to the left, follow up diagnostic mammographic assessment in the nonacute setting is recommended. Lungs/Pleura: Subpleural triangular nodularity in the right upper lobe 0.7 by 0.5 cm on image 33 series 5, probably from atelectasis or scarring given the triangular morphology. Peripheral airspace opacity in the left lower lobe on image 58 of series 5 may reflect pulmonary hemorrhage or infarct. Trace left pleural effusion. As noted above, there is nodularity below the left pulmonary vein which may represent a pleural-based nodule or a pericardial lymph node, measuring 1.0 by 1.3 cm, likely meriting surveillance. Musculoskeletal: Mild thoracic spondylosis. Review of the MIP images confirms the above findings. CT ABDOMEN and PELVIS FINDINGS Hepatobiliary: Four hypodense lesions in the left hepatic lobe are likely cysts but technically too small to characterize. Gallbladder unremarkable. Pancreas: Unremarkable Spleen: Unremarkable Adrenals/Urinary Tract: Unremarkable Stomach/Bowel: Unremarkable Vascular/Lymphatic: Aortoiliac atherosclerotic vascular disease. Filling defect in the left common femoral vein and superficial femoral vein compatible with DVT. No pathologic adenopathy in the abdomen/pelvis. Reproductive: Unremarkable Other: No supplemental non-categorized findings. Musculoskeletal: Deformity of the left hip with suspected healed left hip fracture, and severe degenerative arthropathy and remodeling of the left hip compatible with chronic arthropathy. Moderate degenerative arthropathy of the right hip. Lower lumbar spondylosis and degenerative disc disease with grade 1 anterolisthesis at L4-5, and right foraminal impingement at the L4-5 level. Review of the MIP images confirms the above findings.  IMPRESSION: 1. Very large pulmonary embolus including central and peripheral saddle emboli. Involvement of all lobes. Clot burden is high. Right ventricular left ventricular ratio is abnormally elevated at 1.0, indicating right heart strain. 2. Acute DVT in the left common femoral vein and superficial femoral vein. 3. Peripheral airspace opacity in the left lower lobe compatible with pulmonary hemorrhage and/or infarct. Trace left pleural effusion. 4. Nodularity in the right breast is asymmetric and warrants follow up diagnostic mammography in the nonacute setting. 5. 1.0 by 1.3 cm nodule below the left pulmonary vein is probably a borderline enlarged pericardial lymph node but could represent a peripheral pulmonary nodule. If mammographic workup turns out negative than I would suggest either PET-CT or follow up CT in 3 months time to reassess this lesion. 6. 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 Right foraminal impingement at L4-5. Aortic Atherosclerosis (ICD10-I70.0). Coronary atherosclerosis. Hypodense left hepatic lobe lesions are likely benign but technically too small to characterize. Critical Value/emergent results were called by telephone at the time of interpretation on 03/10/2021 at 7:31 am to provider [facility] , who verbally acknowledged these results. Electronically Signed   By: Gaylyn RongWalter  Liebkemann M.D.   On: 03/10/2021 07:41   ECHOCARDIOGRAM LIMITED  Result Date: 03/10/2021    ECHOCARDIOGRAM LIMITED REPORT   Patient Name:   Denise HumanSUSAN A Weiss Date of Exam: 03/10/2021 Medical Rec #:  [id number]       Height:       64.0 in Accession #:  [id number]      Weight:       155.0 lb Date of Birth:  1951-08-29       BSA:          1.756 m Patient Age:    69 years        BP:           124/51 mmHg Patient Gender: F               HR:           67 bpm. Exam Location:  Inpatient Procedure: Limited Echo, Limited Color Doppler and Cardiac Doppler                     STAT ECHO Reported to: Dr. Flora Lipps on 03/10/2021 9:04:00 AM. Indications:    Pulmonary Embolus I26.09  History:        Patient has no prior history of Echocardiogram examinations.  Sonographer:    Eulah Pont RDCS Referring Phys: [id number] JENNIFER YATES IMPRESSIONS  1. Left ventricular ejection fraction, by estimation, is 60 to 65%. The left ventricle has normal function. The left ventricle has no regional wall motion abnormalities. Left ventricular diastolic parameters were normal.  2. The right ventricular size is normal. There is mildly elevated pulmonary artery systolic pressure. The estimated right ventricular systolic pressure is 37.1 mmHg.  3. The mitral valve is grossly normal. Mild mitral valve regurgitation. No evidence of mitral stenosis.  4. The aortic valve is tricuspid. There is mild thickening of the aortic valve. Aortic valve regurgitation is mild. No aortic stenosis is present.  5. The inferior vena cava is normal in size with greater than 50% respiratory variability, suggesting right atrial pressure of 3 mmHg. FINDINGS  Left Ventricle: Left ventricular ejection fraction, by estimation, is 60 to 65%. The left ventricle has normal function. The left ventricle has no regional wall motion abnormalities. The left ventricular internal cavity size was normal in size. There is  no left ventricular hypertrophy. Left ventricular diastolic parameters were normal. Right Ventricle: The right ventricular size is normal. No increase in right ventricular wall thickness. There is mildly elevated pulmonary artery systolic pressure. The tricuspid regurgitant velocity is 2.92 m/s, and with an assumed right atrial pressure  of 3 mmHg, the estimated right ventricular systolic pressure is 37.1 mmHg. Left Atrium: Left atrial size was normal in size. Right Atrium: Right atrial size was normal in size. Pericardium: Trivial pericardial effusion is present. Mitral Valve: The mitral valve is grossly normal. Mild mitral  valve regurgitation. No evidence of mitral valve stenosis. Tricuspid Valve: The tricuspid valve is grossly normal. Tricuspid valve regurgitation is mild . No evidence of tricuspid stenosis. Aortic Valve: The aortic valve is tricuspid. There is mild thickening of the aortic valve. Aortic valve regurgitation is mild. No aortic stenosis is present. Pulmonic Valve: The pulmonic valve was grossly normal. Pulmonic valve regurgitation is not visualized. No evidence of pulmonic stenosis. Aorta: The aortic root is normal in size and structure. Venous: The right upper pulmonary vein is normal. The inferior vena cava is normal in size with greater than 50% respiratory variability, suggesting right atrial pressure of 3 mmHg. LEFT VENTRICLE PLAX 2D LVIDd:         3.90 cm  Diastology LVIDs:         2.40 cm  LV e' medial:    7.43 cm/s LV PW:         0.70 cm  LV E/e' medial:  12.9 LV IVS:        0.90 cm  LV e' lateral:   6.90 cm/s LVOT diam:     2.00 cm  LV E/e' lateral: 13.8 LV SV:         89 LV SV Index:   51 LVOT Area:     3.14 cm  RIGHT VENTRICLE RV S prime:     9.30 cm/s TAPSE (M-mode): 2.1 cm LEFT ATRIUM         Index LA diam:    2.90 cm 1.65 cm/m  AORTIC VALVE LVOT Vmax:   102.00 cm/s LVOT Vmean:  78.100 cm/s LVOT VTI:    0.283 m  AORTA Ao Root diam: 3.30 cm MITRAL VALVE                TRICUSPID VALVE MV Area (PHT): 2.96 cm     TR Peak grad:   34.1 mmHg MV Decel Time: 256 msec     TR Vmax:        292.00 cm/s MV E velocity: 95.50 cm/s MV A velocity: 111.00 cm/s  SHUNTS MV E/A ratio:  0.86         Systemic VTI:  0.28 m                             Systemic Diam: 2.00 cm Lennie Odor MD Electronically signed by Lennie Odor MD Signature Date/Time: 03/10/2021/10:38:45 AM    Final        Subjective: No new complaints.   Discharge Exam: Vitals:   03/13/21 0807 03/13/21 0950  BP: (!) 111/54 114/63  Pulse: 69 66  Resp: 18   Temp: 98.3 F (36.8 C)   SpO2: 96%    Vitals:   03/12/21 2110 03/13/21 0606 03/13/21  0807 03/13/21 0950  BP: 116/63 (!) 116/56 (!) 111/54 114/63  Pulse: 64 (!) 59 69 66  Resp: 15  18   Temp: 98.6 F (37 C) (!) 97.5 F (36.4 C) 98.3 F (36.8 C)   TempSrc: Oral Oral Oral   SpO2: 93% 96% 96%   Weight:  72.1 kg    Height:        General: Pt is alert, awake, not in acute distress Cardiovascular: RRR, S1/S2 +, no rubs, no gallops Respiratory: CTA bilaterally, no wheezing, no rhonchi Abdominal: Soft, NT, ND, bowel sounds + Extremities: no edema, no cyanosis    The results of significant diagnostics from this hospitalization (including imaging, microbiology, ancillary and laboratory) are listed below for reference.     Microbiology: No results found for this or any previous visit (from the past 240 hour(s)).   Labs: BNP (last 3 results) No results for input(s): BNP in the last 8760 hours. Basic Metabolic Panel: No results for input(s): NA, K, CL, CO2, GLUCOSE, BUN, CREATININE, CALCIUM, MG, PHOS in the last 168 hours. Liver Function Tests: No results for input(s): AST, ALT, ALKPHOS, BILITOT, PROT, ALBUMIN in the last 168 hours. No results for input(s): LIPASE, AMYLASE in the last 168 hours. No results for input(s): AMMONIA in the last 168 hours. CBC: No results for input(s): WBC, NEUTROABS, HGB, HCT, MCV, PLT in the last 168 hours. Cardiac Enzymes: No results for input(s): CKTOTAL, CKMB, CKMBINDEX, TROPONINI in the last 168 hours. BNP: Invalid input(s): POCBNP CBG: No results for input(s): GLUCAP in the last 168 hours. D-Dimer No results for input(s): DDIMER in the last 72 hours. Hgb A1c No results for input(s): HGBA1C  in the last 72 hours. Lipid Profile No results for input(s): CHOL, HDL, LDLCALC, TRIG, CHOLHDL, LDLDIRECT in the last 72 hours. Thyroid function studies No results for input(s): TSH, T4TOTAL, T3FREE, THYROIDAB in the last 72 hours.  Invalid input(s): FREET3 Anemia work up No results for input(s): VITAMINB12, FOLATE, FERRITIN, TIBC, IRON,  RETICCTPCT in the last 72 hours. Urinalysis    Component Value Date/Time   COLORURINE YELLOW 03/09/2021 2246   APPEARANCEUR HAZY (A) 03/09/2021 2246   LABSPEC 1.016 03/09/2021 2246   PHURINE 6.0 03/09/2021 2246   GLUCOSEU NEGATIVE 03/09/2021 2246   HGBUR MODERATE (A) 03/09/2021 2246   BILIRUBINUR NEGATIVE 03/09/2021 2246   KETONESUR NEGATIVE 03/09/2021 2246   PROTEINUR NEGATIVE 03/09/2021 2246   NITRITE NEGATIVE 03/09/2021 2246   LEUKOCYTESUR TRACE (A) 03/09/2021 2246   Sepsis Labs Invalid input(s): PROCALCITONIN,  WBC,  LACTICIDVEN Microbiology No results found for this or any previous visit (from the past 240 hour(s)).   Time coordinating discharge: 36 minutes.   SIGNED:   Kathlen Mody, MD  [facility]

## 2021-03-24 NOTE — ED Provider Notes (Signed)
[facility] PROGRESSIVE CARE Provider Note   CSN: [id number] Arrival date & time: 03/09/21  2238     History Chief Complaint  Patient presents with  . Abdominal Pain    Denise Weiss is a 70 y.o. female.   Abdominal Pain Pain location:  LUQ Pain quality: sharp and shooting   Pain radiates to:  Chest and L shoulder Pain severity:  Moderate Timing:  Constant Progression:  Worsening Chronicity:  New Context: not alcohol use   Relieved by:  None tried Worsened by:  Nothing Ineffective treatments:  None tried Associated symptoms: chest pain and shortness of breath   Associated symptoms: no fever        Past Medical History:  Diagnosis Date  . Arthritis   . Fibromyalgia   . Hypercholesteremia   . Scleroderma (HCC)   . Stage 3a chronic kidney disease [facility])     Patient Active Problem List   Diagnosis Date Noted  . 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 Persistent nodularity of breast 03/10/2021  . Scleroderma (HCC) 03/10/2021  . Essential hypertension 03/10/2021  . Stage 3a chronic kidney disease (HCC) 03/10/2021  . Hypothyroidism (acquired) 03/10/2021  . Dyslipidemia 03/10/2021  . Mood disorder (HCC) 03/10/2021    Past Surgical History:  Procedure Laterality Date  . CESAREAN SECTION    . KNEE SURGERY       OB History   No obstetric history on file.     History reviewed. No pertinent family history.  Social History   Tobacco Use  . Smoking status: Never Smoker  . Smokeless tobacco: Never Used  Substance Use Topics  . Alcohol use: Not Currently  . Drug use: Never    Home Medications Prior to Admission medications   Medication Sig Start Date End Date Taking? Authorizing Provider  ALPRAZolam (XANAX) 0.25 MG tablet Take 0.25-0.5 mg by mouth daily as needed for sleep. 02/27/21  Yes [provider]  ARIPiprazole (ABILIFY) 5 MG tablet Take 5 mg by mouth at bedtime. 03/06/21  Yes [provider]  atenolol (TENORMIN) 50 MG tablet Take 50 mg by mouth 2 (two) times daily. 02/08/21  Yes [provider]  atorvastatin (LIPITOR) 20 MG tablet Take 20 mg by mouth daily. 03/07/21  Yes [provider]  levothyroxine (SYNTHROID) 88 MCG tablet Take 88 mcg by mouth every morning. 03/06/21  Yes [provider]  losartan (COZAAR) 25 MG tablet Take 50 mg by mouth daily. 01/02/21  Yes [provider]  omeprazole (PRILOSEC) 20 MG capsule Take 20 mg by mouth daily.   Yes [provider]  traMADol (ULTRAM) 50 MG tablet Take 100 mg by mouth every 12 (twelve) hours. 03/01/21  Yes [provider]  TRINTELLIX 5 MG TABS tablet Take 5 mg by mouth daily. 03/07/21  Yes [provider]  apixaban (ELIQUIS) 5 MG TABS tablet Take 2 tablets (10 mg total) by mouth 2 (two) times daily for 7 days. 03/13/21 03/20/21  Kathlen Mody, MD  apixaban (ELIQUIS) 5 MG TABS tablet Take 1 tablet (5 mg total) by mouth 2 (two) times daily. Start on 03/21/2021 after you finish the 10 mg dose on 03/20/21 03/21/21   Kathlen Mody, MD  docusate sodium (COLACE) 100 MG capsule Take 1 capsule (100 mg total) by mouth 2 (two) times daily. 03/13/21   Kathlen Mody, MD  Multiple Vitamin (MULTIVITAMIN WITH MINERALS) TABS tablet Take 1 tablet by mouth daily. 03/14/21  Kathlen Mody, MD  Nutritional Supplements (,FEEDING SUPPLEMENT, PROSOURCE PLUS) liquid Take 30 mLs by mouth daily. 03/14/21   Kathlen Mody, MD    Allergies    Patient has no known allergies.  Review of Systems   Review of Systems  Constitutional: Negative for fever.  Respiratory: Positive for shortness of breath.   Cardiovascular: Positive for chest pain.  Gastrointestinal: Positive for abdominal pain.  All other systems reviewed and are negative.   Physical Exam Updated Vital Signs BP 114/63 (BP Location: Right Arm)   Pulse 66   Temp 98.3 F (36.8 C) (Oral)   Resp 18   Ht 5\' 4"  (1.626 m)   Wt 72.1 kg   SpO2  96%   BMI 27.28 kg/m   Physical Exam Vitals and nursing note reviewed.  Constitutional:      Appearance: She is well-developed.  HENT:     Head: Normocephalic and atraumatic.     Nose: Nose normal. No congestion or rhinorrhea.     Mouth/Throat:     Mouth: Mucous membranes are moist.     Pharynx: Oropharynx is clear.  Eyes:     Pupils: Pupils are equal, round, and reactive to light.  Cardiovascular:     Rate and Rhythm: Normal rate and regular rhythm.     Heart sounds: Normal heart sounds.  Pulmonary:     Effort: No respiratory distress.     Breath sounds: No stridor.  Abdominal:     General: There is no distension.     Palpations: Abdomen is soft.     Tenderness: There is no abdominal tenderness.  Musculoskeletal:        General: No swelling or tenderness. Normal range of motion.     Cervical back: Normal range of motion.  Skin:    General: Skin is warm and dry.  Neurological:     General: No focal deficit present.     Mental Status: She is alert.     ED Results / Procedures / Treatments   Labs (all labs ordered are listed, but only abnormal results are displayed) Labs Reviewed  URINE CULTURE - Abnormal; Notable for the following components:      Result Value   Culture MULTIPLE SPECIES PRESENT, SUGGEST RECOLLECTION (*)    All other components within normal limits  CBC WITH DIFFERENTIAL/PLATELET - Abnormal; Notable for the following components:   WBC 12.8 (*)    Neutro Abs 10.1 (*)    All other components within normal limits  COMPREHENSIVE METABOLIC PANEL - Abnormal; Notable for the following components:   Glucose, Bld 119 (*)    Creatinine, Ser 1.20 (*)    Total Protein 6.4 (*)    Total Bilirubin 2.5 (*)    GFR, Estimated 49 (*)    All other components within normal limits  URINALYSIS, ROUTINE W REFLEX MICROSCOPIC - Abnormal; Notable for the following components:   APPearance HAZY (*)    Hgb urine dipstick MODERATE (*)    Leukocytes,Ua TRACE (*)     Bacteria, UA RARE (*)    All other components within normal limits  HEPARIN LEVEL (UNFRACTIONATED) - Abnormal; Notable for the following components:   Heparin Unfractionated >1.10 (*)    All other components within normal limits  BASIC METABOLIC PANEL - Abnormal; Notable for the following components:   Creatinine, Ser 1.12 (*)    Calcium 8.7 (*)    GFR, Estimated 53 (*)    All other components within normal limits  HEPARIN LEVEL (UNFRACTIONATED) -  Abnormal; Notable for the following components:   Heparin Unfractionated 0.78 (*)    All other components within normal limits  CBC - Abnormal; Notable for the following components:   RBC 3.75 (*)    Hemoglobin 11.5 (*)    HCT 35.2 (*)    All other components within normal limits  BASIC METABOLIC PANEL - Abnormal; Notable for the following components:   Glucose, Bld 102 (*)    Creatinine, Ser 1.19 (*)    Calcium 8.5 (*)    GFR, Estimated 49 (*)    All other components within normal limits  CBC - Abnormal; Notable for the following components:   RBC 3.62 (*)    Hemoglobin 11.3 (*)    HCT 34.2 (*)    All other components within normal limits  BASIC METABOLIC PANEL - Abnormal; Notable for the following components:   Creatinine, Ser 1.10 (*)    Calcium 8.5 (*)    GFR, Estimated 54 (*)    All other components within normal limits  RESP PANEL BY RT-PCR (FLU A&B, COVID) ARPGX2  LIPASE, BLOOD  HIV ANTIBODY (ROUTINE TESTING W REFLEX)  CBC  HEPARIN LEVEL (UNFRACTIONATED)  HEPARIN LEVEL (UNFRACTIONATED)  HEPARIN LEVEL (UNFRACTIONATED)  HEPARIN LEVEL (UNFRACTIONATED)  HEPARIN LEVEL (UNFRACTIONATED)  TROPONIN I (HIGH SENSITIVITY)  TROPONIN I (HIGH SENSITIVITY)    EKG EKG Interpretation  Date/Time:  Thursday Mar 09 2021 22:47:50 EDT Ventricular Rate:  75 PR Interval:  164 QRS Duration: 66 QT Interval:  390 QTC Calculation: 435 R Axis:   -30 Text Interpretation: Normal sinus rhythm Left axis deviation Low voltage QRS Cannot rule  out Anterior infarct , age undetermined Abnormal ECG Confirmed by Geoffery Lyons ([id number]) on 03/10/2021 11:25:27 PM   Radiology No results found.  Procedures .Critical Care Performed by: Marily Memos, MD Authorized by: Marily Memos, MD   Critical care provider statement:    Critical care time (minutes):  45   Critical care was necessary to treat or prevent imminent or life-threatening deterioration of the following conditions:  Circulatory failure and respiratory failure   Critical care was time spent personally by me on the following activities:  Discussions with consultants, evaluation of patient's response to treatment, examination of patient, ordering and performing treatments and interventions, ordering and review of laboratory studies, ordering and review of radiographic studies, pulse oximetry, re-evaluation of patient's condition, obtaining history from patient or surrogate and review of old charts     Medications Ordered in ED Medications  acetaminophen (TYLENOL) tablet 1,000 mg (1,000 mg Oral Given 03/10/21 0607)  iohexol (OMNIPAQUE) 350 MG/ML injection 80 mL (80 mLs Intravenous Contrast Given 03/10/21 0722)  heparin bolus via infusion 4,500 Units (4,500 Units Intravenous Bolus from Bag 03/10/21 0838)    ED Course  I have reviewed the triage vital signs and the nursing notes.  Pertinent labs & imaging results that were available during my care of the patient were reviewed by me and considered in my medical decision making (see chart for details).    MDM Rules/Calculators/A&P                         Concern for PE vs gastritis vs splenic/renal pathology. Care transferred pending CT scans.    Final Clinical Impression(s) / ED Diagnoses Final diagnoses:  Acute saddle pulmonary embolism, unspecified whether acute cor pulmonale present (HCC)    Rx / DC Orders ED Discharge Orders         Ordered  apixaban (ELIQUIS) 5 MG TABS tablet  2 times daily        03/13/21 1225     Nutritional Supplements (,FEEDING SUPPLEMENT, PROSOURCE PLUS) liquid  Daily        03/13/21 1225    Multiple Vitamin (MULTIVITAMIN WITH MINERALS) TABS tablet  Daily        03/13/21 1225    apixaban (ELIQUIS) 5 MG TABS tablet  2 times daily        03/13/21 1225    docusate sodium (COLACE) 100 MG capsule  2 times daily        03/13/21 1225    Increase activity slowly        03/13/21 1225    Diet - low sodium heart healthy        03/13/21 1225    Discharge instructions       Comments: Follow up with PCP in one week.   03/13/21 1225           Cyana Shook, Barbara CowerJason, MD 03/24/21 1456

## 2021-04-06 ENCOUNTER — Other Ambulatory Visit: Payer: MEDICARE

## 2021-04-18 DIAGNOSIS — E7800 Pure hypercholesterolemia, unspecified: Secondary | ICD-10-CM | POA: Diagnosis not present

## 2021-04-18 DIAGNOSIS — I129 Hypertensive chronic kidney disease with stage 1 through stage 4 chronic kidney disease, or unspecified chronic kidney disease: Secondary | ICD-10-CM | POA: Diagnosis not present

## 2021-04-18 DIAGNOSIS — Z6825 Body mass index (BMI) 25.0-25.9, adult: Secondary | ICD-10-CM | POA: Diagnosis not present

## 2021-04-18 DIAGNOSIS — R9389 Abnormal findings on diagnostic imaging of other specified body structures: Secondary | ICD-10-CM | POA: Diagnosis not present

## 2021-04-18 DIAGNOSIS — N183 Chronic kidney disease, stage 3 unspecified: Secondary | ICD-10-CM | POA: Diagnosis not present

## 2021-04-18 DIAGNOSIS — R251 Tremor, unspecified: Secondary | ICD-10-CM | POA: Diagnosis not present

## 2021-04-18 DIAGNOSIS — I2602 Saddle embolus of pulmonary artery with acute cor pulmonale: Secondary | ICD-10-CM | POA: Diagnosis not present

## 2021-04-28 ENCOUNTER — Encounter: Payer: Self-pay | Admitting: Internal Medicine

## 2021-04-28 ENCOUNTER — Other Ambulatory Visit: Payer: Self-pay

## 2021-04-28 ENCOUNTER — Ambulatory Visit (INDEPENDENT_AMBULATORY_CARE_PROVIDER_SITE_OTHER): Payer: Medicare Other | Admitting: Internal Medicine

## 2021-04-28 VITALS — BP 112/63 | HR 52 | Resp 13 | Ht 64.0 in | Wt 148.0 lb

## 2021-04-28 DIAGNOSIS — N1831 Chronic kidney disease, stage 3a: Secondary | ICD-10-CM

## 2021-04-28 DIAGNOSIS — M349 Systemic sclerosis, unspecified: Secondary | ICD-10-CM | POA: Diagnosis not present

## 2021-04-28 DIAGNOSIS — I2602 Saddle embolus of pulmonary artery with acute cor pulmonale: Secondary | ICD-10-CM | POA: Diagnosis not present

## 2021-04-28 NOTE — Progress Notes (Signed)
Office Visit Note  Patient: Denise Weiss             Date of Birth: 1951-03-11           MRN: [id number]             PCP: [name], MD Referring: Soundra Pilon, FNP Visit Date: 04/28/2021 Occupation: Retired, current full time primary care giver  Subjective:   History of Present Illness: Denise Weiss is a 70 y.o. female with a history of bipolar disorder, CKD stage 3 hypertensive nephropathy, here for scleroderma and fibromyalgia. She was recently hospitalized due to large saddle pulmonary embolus with cor pulmonale last month and on anticoagulation treatment with eliquis.  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  She states this has been a very slowly progressive problem involving areas starting distally on the leg and more recently involving proximal areas.  However past year or so she has noticed extension and developed involvement around her left buttock.  She saw Dr. Jorja Loa with biopsy of this rash demonstrated scleroderma.  She has been told there is no specific treatment for this problem and does not take any medicines for it.  Besides the skin rashes she does not have any known other organ involvement for this condition. She had developed some symptoms of nausea, vomiting, dyspnea problems last year that was attributed to serotonin syndrome as she takes medication for bipolar disorder and fibromyalgia syndrome and improved with adjustment to these. Then just last month she developed left lower left chest pain leading to evaluation with large PE identified. She does not know of any prior diagnosis for pulmonary hypertension or lung disease. She had one past blood clot in the leg that was provoked years ago and took anticoagulation for 6 months.  Imaging reviewed 03/2021 CTA chest/CT abdomen pelvis IMPRESSION: 1. Very large pulmonary embolus including central and peripheral saddle  emboli. Involvement of all lobes. Clot burden is high. Right ventricular left ventricular ratio is abnormally elevated at 1.0, indicating right heart strain. 2. Acute DVT in the left common femoral vein and superficial femoral vein. 3. Peripheral airspace opacity in the left lower lobe compatible with pulmonary hemorrhage and/or infarct. Trace left pleural effusion. 4. Nodularity in the right breast is asymmetric and warrants follow up diagnostic mammography in the nonacute setting. 5. 1.0 by 1.3 cm nodule below the left pulmonary vein is probably a borderline enlarged pericardial lymph node but could represent a peripheral pulmonary nodule. If mammographic workup turns out negative than I would suggest either PET-CT or follow up CT in 3 months time to reassess this lesion. 6. 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 Right foraminal impingement at L4-5. Aortic Atherosclerosis (ICD10-I70.0). Coronary atherosclerosis. Hypodense left hepatic lobe lesions are likely benign but technically too small to characterize.    Review of Systems  Respiratory:  Negative for shortness of breath and difficulty breathing.   Cardiovascular:  Negative for chest pain.  Gastrointestinal:  Positive for constipation and diarrhea. Negative for nausea and vomiting.  Skin:  Positive for rash and skin tightness. Negative for color change.   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   Objective: Vital Signs: BP 112/63 (BP Location: Left Arm, Patient Position: Sitting, Cuff Size: Small)   Pulse (!) 52   Resp 13   Ht 5\' 4"  (1.626 m)   Wt 148 lb (67.1 kg)   BMI 25.40 kg/m    Physical Exam HENT:     Left Ear: External ear normal.     Mouth/Throat:     Mouth: Mucous membranes are moist.     Pharynx: Oropharynx is clear.     Comments: Venous lake on lower lip Eyes:     Conjunctiva/sclera: Conjunctivae normal.  Cardiovascular:     Rate and Rhythm: Normal rate and regular rhythm.  Pulmonary:     Effort: Pulmonary effort is normal.     Breath sounds: Normal breath sounds.  Skin:    General: Skin is warm and dry.     Comments: Multiple skin telangiectasias and angiomas present throughout the trunk front and back Patches of tight inflexible skin with very slight discoloration on lateral and posterior right leg and over left buttock No digital pitting or tapering, normal nailfold capillaroscopy  Neurological:     General: No focal deficit present.     Mental Status: She is alert.  Psychiatric:        Mood and Affect: Mood normal.     Comments: Somewhat flat affect     Musculoskeletal Exam:  Neck full ROM no tenderness Shoulders full ROM no tenderness or swelling Elbows full ROM no tenderness or swelling Wrists full ROM no tenderness or swelling Fingers full ROM no tenderness or  swelling Knees full ROM no tenderness or swelling Ankles full ROM no tenderness or swelling   Investigation: No additional findings.  Imaging: No results found.  Recent Labs: Lab Results  Component Value Date   WBC 6.5 03/13/2021   HGB 11.3 (L) 03/13/2021   PLT 205 03/13/2021   NA 140 03/13/2021   K 4.5 03/13/2021   CL 108 03/13/2021   CO2 27 03/13/2021   GLUCOSE 90 03/13/2021   BUN 10 03/13/2021   CREATININE 1.10 (H) 03/13/2021   BILITOT 2.5 (H) 03/09/2021   ALKPHOS 94 03/09/2021   AST 18 03/09/2021   ALT 12 03/09/2021   PROT 6.4 (L) 03/09/2021   ALBUMIN 3.5 03/09/2021   CALCIUM 8.5 (L) 03/13/2021    Speciality Comments: No specialty comments available.  Procedures:  No procedures performed Allergies: Patient has no known allergies.   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  Normal oral aperture, normal nailfold capillaroscopy, lack of sclerodactyly or Raynaud's makes systemic sclerosis unlikely.  We will check specific autoantibodies for markers of systemic sclerosis or a mixed connective tissue disease.  If these are negative systemic disease seems extremely unlikely.  Acute saddle pulmonary embolism with acute cor pulmonale (HCC)  Recent hospitalization with large pulmonary embolus and associated heart strain.  She has no prior known history of pulmonary hypertension or right heart strain and looks like repeat imaging is scheduled for next month to reassess.  Stage 3a chronic kidney disease (HCC)  Chronic renal disease has been attributed to hypertensive nephropathy blood pressure appears well controlled today.  Orders: Orders Placed This Encounter  Procedures   Anti-scleroderma antibody   Centromere Antibodies   ANA   RNP Antibody    No orders of the defined types  were placed in this encounter.    Follow-Up Instructions: No follow-ups on file.   Fuller Plan, MD  Note - This record has been created using [employer].  Chart creation errors have been sought, but may not always  have been located. Such creation errors do not reflect on  the standard of medical care.

## 2021-04-28 NOTE — Patient Instructions (Signed)
Scleroderma Scleroderma is a rare and long-term (chronic) disease of the immune system. The immune system protects the body by attacking germs that cause illness. If you have scleroderma, your immune system mistakenly attacks your skin and other parts of your body instead. This iscalled an autoimmune disease. Scleroderma means hardening of the skin. If you have a mild form of this condition, it may affect only your skin (localized scleroderma). If you have a severe form, it may affect your skin and also your blood vessels, lungs, kidneys, heart, and digestive system (systemic scleroderma). What are the causes? The cause of this condition is not known. What increases the risk? You are more likely to develop this condition if: You have a family history of scleroderma. You are female. You are 65-30 years old. What are the signs or symptoms? Symptoms of this condition depend on the type of scleroderma you have. They also vary from person to person. Symptoms of localized scleroderma may include: Discolored patches of skin (morphea). These may be thick and waxy. Bands of thick, hard skin on your arms, legs, or face (linear scleroderma). Tightening of the skin that limits how well you can move your joints. Open skin sores. Symptoms of systemic scleroderma may include: Discoloration of the fingers and sometimes the toes (Raynaud's phenomenon). Your fingers or toes may turn blue, white, or red. You may also have tingling or numbness. Exposure to cold often triggers this symptom. Tightening of the skin of the fingers, hands, arms, neck, and face. Enlarged blood vessels of the hands, face, and nail beds (telangiectasias). Calcium deposits under your skin (calcinosis). Joint pain. Heartburn. High blood pressure. Constipation. Trouble swallowing (dysphagia). Trouble breathing. How is this diagnosed? This condition may be diagnosed based on: Your symptoms and medical history. A physical  exam. Tests, such as: Imaging studies. This may include X-rays and CT scan. Blood tests. Lung (pulmonary) function tests. Scleroderma can be hard to diagnose because other diseases have many of the same symptoms. You may need to see specialists as directed by your health careprovider. How is this treated? There is no cure for this condition, but treatment can relieve symptoms and prevent complications. Mild symptoms may not need treatment. Treatment may include taking medicines to: Improve blood flow. Block production of stomach acid to treat heartburn. Treat high blood pressure caused by kidney disease. Relieve joint pain and inflammation. Treat lung symptoms. Follow these instructions at home:  Medicines Take over-the-counter and prescription medicines only as told by your health care provider. Do not drive or use heavy machinery while taking prescription pain medicine. Eating and drinking Eat a healthy diet. Drink enough fluid to keep your urine pale yellow. If you have heartburn: Eat smaller meals often. Avoid spicy and fatty foods. Do not eat meals late in the evening. Avoid lying down right after you eat. Lifestyle  If you have Raynaud's phenomenon, cold can trigger your symptoms. To prevent symptoms, you can: Wear mittens, a hat, a scarf, and warm footwear. Dress in layers during cold weather. If possible, stay indoors during cold weather. Wear comfortable shoes that are well cushioned. Protect your skin with sunscreen and moisturizers. Stretch and exercise regularly. Maintain a healthy weight.  General instructions Learn as much as you can about scleroderma, and work closely with your team of health care providers. Do not use any products that contain nicotine or tobacco, such as cigarettes and e-cigarettes. If you need help quitting, ask your health care provider. Ask your health care provider how to  check your blood pressure at home. Check it as directed by your  health care provider. Make sure you have a good support system at home. Use skin moisturizer to keep your skin moist. Keep all follow-up visits as told by your health care provider. This is important. Contact a health care provider if: Your scleroderma symptoms change or become worse. You have concerns about your mental health. Get help right away if: A finger or toe becomes painful or numb. A finger or toe turns black or a very dark color. You have: Swelling, pain, or tenderness in an arm or leg. Trouble swallowing. Trouble breathing. Chest pain. You cough up blood. Summary Scleroderma is a rare and long-term (chronic) disease of the immune system. If you have scleroderma, your immune system mistakenly attacks your skin and other parts of your body. Symptoms of this condition depend on the type of scleroderma you have. They also vary from person to person. There is no cure for this condition, but treatment can relieve symptoms and prevent complications. This information is not intended to replace advice given to you by your health care provider. Make sure you discuss any questions you have with your healthcare provider. Document Revised: 11/29/2017 Document Reviewed: 11/29/2017 Elsevier Patient Education  2022 [employer].

## 2021-05-01 ENCOUNTER — Telehealth: Payer: Self-pay | Admitting: Hematology and Oncology

## 2021-05-01 LAB — ANA: Anti Nuclear Antibody (ANA): NEGATIVE

## 2021-05-01 LAB — ANTI-SCLERODERMA ANTIBODY: Scleroderma (Scl-70) (ENA) Antibody, IgG: <1 AI

## 2021-05-01 LAB — RNP ANTIBODY: Ribonucleic Protein(ENA) Antibody, IgG: <1 AI

## 2021-05-01 LAB — CENTROMERE ANTIBODIES: Centromere Ab Screen: <1 AI

## 2021-05-01 NOTE — Telephone Encounter (Signed)
Received a new hem referral from Dr. Hyacinth Meeker for Saddle embolus of pulmonary artery with acute cor pulmonale. Ms. Denise Weiss has been cld and scheduled to see Dr. Leonides Schanz on 7/8 at 1:40pm. Pt aware to arrive 20 minutes early.

## 2021-05-12 ENCOUNTER — Inpatient Hospital Stay: Payer: Medicare Other | Attending: Hematology and Oncology | Admitting: Hematology and Oncology

## 2021-05-12 ENCOUNTER — Other Ambulatory Visit: Payer: Self-pay | Admitting: Hematology and Oncology

## 2021-05-12 ENCOUNTER — Other Ambulatory Visit: Payer: Self-pay

## 2021-05-12 ENCOUNTER — Inpatient Hospital Stay: Payer: Medicare Other

## 2021-05-12 VITALS — BP 136/50 | HR 58 | Temp 98.6°F | Resp 19 | Ht 64.0 in | Wt 145.7 lb

## 2021-05-12 DIAGNOSIS — M3500 Sicca syndrome, unspecified: Secondary | ICD-10-CM | POA: Diagnosis not present

## 2021-05-12 DIAGNOSIS — Z7901 Long term (current) use of anticoagulants: Secondary | ICD-10-CM | POA: Insufficient documentation

## 2021-05-12 DIAGNOSIS — I2692 Saddle embolus of pulmonary artery without acute cor pulmonale: Secondary | ICD-10-CM | POA: Diagnosis not present

## 2021-05-12 DIAGNOSIS — I2602 Saddle embolus of pulmonary artery with acute cor pulmonale: Secondary | ICD-10-CM

## 2021-05-12 DIAGNOSIS — Z86718 Personal history of other venous thrombosis and embolism: Secondary | ICD-10-CM | POA: Diagnosis not present

## 2021-05-12 DIAGNOSIS — D500 Iron deficiency anemia secondary to blood loss (chronic): Secondary | ICD-10-CM | POA: Diagnosis not present

## 2021-05-12 DIAGNOSIS — N189 Chronic kidney disease, unspecified: Secondary | ICD-10-CM | POA: Insufficient documentation

## 2021-05-12 LAB — FERRITIN: Ferritin: 73 ng/mL (ref 11–307)

## 2021-05-12 LAB — CBC WITH DIFFERENTIAL (CANCER CENTER ONLY)
Abs Immature Granulocytes: 0.03 10*3/uL (ref 0.00–0.07)
Basophils Absolute: 0.1 10*3/uL (ref 0.0–0.1)
Basophils Relative: 1 %
Eosinophils Absolute: 0.2 10*3/uL (ref 0.0–0.5)
Eosinophils Relative: 2 %
HCT: 42.7 % (ref 36.0–46.0)
Hemoglobin: 14 g/dL (ref 12.0–15.0)
Immature Granulocytes: 0 %
Lymphocytes Relative: 22 %
Lymphs Abs: 1.7 10*3/uL (ref 0.7–4.0)
MCH: 31 pg (ref 26.0–34.0)
MCHC: 32.8 g/dL (ref 30.0–36.0)
MCV: 94.5 fL (ref 80.0–100.0)
Monocytes Absolute: 0.4 10*3/uL (ref 0.1–1.0)
Monocytes Relative: 5 %
Neutro Abs: 5.1 10*3/uL (ref 1.7–7.7)
Neutrophils Relative %: 70 %
Platelet Count: 209 10*3/uL (ref 150–400)
RBC: 4.52 MIL/uL (ref 3.87–5.11)
RDW: 13.9 % (ref 11.5–15.5)
WBC Count: 7.4 10*3/uL (ref 4.0–10.5)
nRBC: 0 % (ref 0.0–0.2)

## 2021-05-12 LAB — CMP (CANCER CENTER ONLY)
ALT: 23 U/L (ref 0–44)
AST: 26 U/L (ref 15–41)
Albumin: 4 g/dL (ref 3.5–5.0)
Alkaline Phosphatase: 89 U/L (ref 38–126)
Anion gap: 12 (ref 5–15)
BUN: 11 mg/dL (ref 8–23)
CO2: 29 mmol/L (ref 22–32)
Calcium: 10.1 mg/dL (ref 8.9–10.3)
Chloride: 104 mmol/L (ref 98–111)
Creatinine: 1.25 mg/dL — ABNORMAL HIGH (ref 0.44–1.00)
GFR, Estimated: 46 mL/min — ABNORMAL LOW (ref >60)
Glucose, Bld: 101 mg/dL — ABNORMAL HIGH (ref 70–99)
Potassium: 4.5 mmol/L (ref 3.5–5.1)
Sodium: 145 mmol/L (ref 135–145)
Total Bilirubin: 2.2 mg/dL — ABNORMAL HIGH (ref 0.3–1.2)
Total Protein: 7.1 g/dL (ref 6.5–8.1)

## 2021-05-12 LAB — IRON AND TIBC
Iron: 117 ug/dL (ref 41–142)
Saturation Ratios: 36 % (ref 21–57)
TIBC: 324 ug/dL (ref 236–444)
UIBC: 207 ug/dL (ref 120–384)

## 2021-05-12 MED ORDER — PREDNISONE 20 MG PO TABS: 60.0000 mg | ORAL_TABLET | Freq: Every day | ORAL | 5 refills | Status: DC

## 2021-05-12 NOTE — Progress Notes (Signed)
[facility] [phone]   [phone]  INITIAL CONSULT NOTE  Patient Care Team: [name], MD as PCP - General (Family Medicine)  Hematological/Oncological History # Saddle Pulmonary Embolism 03/10/2021: presented to the ED with left sided rib pain. CTA pulmonary study showed large pulmonary embolus including central and peripheral saddle emboli with heart strain.  05/12/2021: establish care with Dr. Leonides Schanz   CHIEF COMPLAINTS/PURPOSE OF CONSULTATION:  "Saddle Pulmonary Embolism "  HISTORY OF PRESENTING ILLNESS:  Denise Weiss 70 y.o. female with medical history significant for fibromyalgia, hypertension, hypothyroidism, scleroderma, Sjogren's syndrome, and chronic kidney disease who presents for evaluation of a saddle pulmonary emboli.  On review of the previous records Mrs. Shiveley presented to the ED on 03/10/2021 with left-sided rib pain.  A CTA study showed large pulmonary embolus including central and peripheral saddle emboli with heart strain.  She was admitted and started on anticoagulation therapy.  She is subsequently discharged on Eliquis therapy.  Due to this saddle pulmonary emboli the patient was referred to hematology for further evaluation management.  On exam today Mrs. Welte her symptoms began in the summer where she began developing spells of shortness of breath.  It eventually worsened where she developed occasional episodes of vomiting.  It was recommend that she adjust her blood pressure medications at that time.  She subsequently developed pain under the left rib which is why she initially presented to the hospital.  She noted that she has tolerated anticoagulation well since that time.  She is had no issues with bleeding or dark bruising.  She reports that she does feel stronger and is more active than she was before and her strength is improving.  She notes that she is still recovering.  On further review she notes that she did have  a DVT on her lower right leg in 1995.  She notes that it occurred after she had a surgery on the left knee.  She notes that she was initially treated with heparin and then Coumadin therapy.  Her family history is remarkable for heart disease, stroke in her father and there is no family history of cancer.  On further discussion she notes that she has a mammogram due next week.  She does not wish to have any screening done for colon cancer as she does not want to know if it is there and does not want to treated if it is.  She does that she is a never smoker and only rarely drinks alcohol.  She previously worked as a Artist.  Her medical history is also remarkable for scleroderma which is currently under good control.  She did receive the COVID-vaccine in May 2021.  She otherwise currently denies any fevers, chills, sweats, nausea, vomiting or diarrhea.  A full 10 point ROS is listed below.  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)    Scleroderma (HCC)    Sjogren's syndrome (HCC)    Stage 3a chronic kidney disease (HCC)    Tremor     SURGICAL HISTORY: Past Surgical History:  Procedure Laterality Date   CESAREAN SECTION     KNEE SURGERY     TONSILLECTOMY      SOCIAL HISTORY: Social History   Socioeconomic History  Marital status: Single    Spouse name: Not on file   Number of children: Not on file   Years of education: Not on file   Highest education level: Not on file  Occupational History   Occupation: retired  Tobacco Use   Smoking status: Never   Smokeless tobacco: Never  Vaping Use   Vaping Use: Never used  Substance and Sexual Activity   Alcohol use: Not Currently   Drug use: Never   Sexual activity: Not on file  Other Topics Concern   Not on file  Social  History Narrative   Not on file   Social Determinants of Health   Financial Resource Strain: Not on file  Food Insecurity: Not on file  Transportation Needs: Not on file  Physical Activity: Not on file  Stress: Not on file  Social Connections: Not on file  Intimate Partner Violence: Not on file    FAMILY HISTORY: Family History  Problem Relation Age of Onset   Hepatitis C Mother    Heart attack Father    Schizophrenia Sister     ALLERGIES:  has No Known Allergies.  MEDICATIONS:  Current Outpatient Medications  Medication Sig Dispense Refill   ALPRAZolam (XANAX) 0.25 MG tablet Take 0.25-0.5 mg by mouth daily as needed for sleep.     apixaban (ELIQUIS) 5 MG TABS tablet Take 1 tablet (5 mg total) by mouth 2 (two) times daily. Start on 03/21/2021 after you finish the 10 mg dose on 03/20/21 60 tablet 1   ARIPiprazole (ABILIFY) 5 MG tablet Take 5 mg by mouth at bedtime.     atenolol (TENORMIN) 50 MG tablet Take 50 mg by mouth 2 (two) times daily.     atorvastatin (LIPITOR) 20 MG tablet Take 20 mg by mouth daily.     docusate sodium (COLACE) 100 MG capsule Take 1 capsule (100 mg total) by mouth 2 (two) times daily. (Patient not taking: Reported on 04/28/2021) 10 capsule 0   levothyroxine (SYNTHROID) 100 MCG tablet Take 100 mcg by mouth every morning.     losartan (COZAAR) 25 MG tablet Take 50 mg by mouth daily.     Multiple Vitamin (MULTIVITAMIN WITH MINERALS) TABS tablet Take 1 tablet by mouth daily.     omeprazole (PRILOSEC) 20 MG capsule Take 20 mg by mouth daily.     traMADol (ULTRAM) 50 MG tablet Take 100 mg by mouth every 12 (twelve) hours.     traZODone (DESYREL) 50 MG tablet Take 50 mg by mouth at bedtime. Take 1-3 tablets at bedtime as needed     triamcinolone cream (KENALOG) 0.1 % Apply 1 application topically 2 (two) times daily.     TRINTELLIX 5 MG TABS tablet Take 5 mg by mouth daily.     No current facility-administered medications for this visit.    REVIEW OF  SYSTEMS:   Constitutional: ( - ) fevers, ( - )  chills , ( - ) night sweats Eyes: ( - ) blurriness of vision, ( - ) double vision, ( - ) watery eyes Ears, nose, mouth, throat, and face: ( - ) mucositis, ( - ) sore throat Respiratory: ( - ) cough, ( - ) dyspnea, ( - ) wheezes Cardiovascular: ( - ) palpitation, ( - ) chest discomfort, ( - ) lower extremity swelling Gastrointestinal:  ( - ) nausea, ( - ) heartburn, ( - ) change in bowel habits Skin: ( - ) abnormal skin rashes Lymphatics: ( - ) new lymphadenopathy, ( - )  easy bruising Neurological: ( - ) numbness, ( - ) tingling, ( - ) new weaknesses Behavioral/Psych: ( - ) mood change, ( - ) new changes  All other systems were reviewed with the patient and are negative.  PHYSICAL EXAMINATION: ECOG PERFORMANCE STATUS: 1 - Symptomatic but completely ambulatory  Vitals:   05/12/21 1344  BP: (!) 136/50  Pulse: (!) 58  Resp: 19  Temp: 98.6 F (37 C)  SpO2: 98%   Filed Weights   05/12/21 1344  Weight: 145 lb 11.2 oz (66.1 kg)    GENERAL: well appearing elderly Caucasian female in NAD  SKIN: skin color, texture, turgor are normal, no rashes or significant lesions EYES: conjunctiva are pink and non-injected, sclera clear LUNGS: clear to auscultation and percussion with normal breathing effort HEART: regular rate & rhythm and no murmurs and no lower extremity edema PSYCH: alert & oriented x 3, fluent speech NEURO: no focal motor/sensory deficits  LABORATORY DATA:  I have reviewed the data as listed CBC Latest Ref Rng & Units 05/12/2021 03/13/2021 03/12/2021  WBC 4.0 - 10.5 K/uL 7.4 6.5 6.9  Hemoglobin 12.0 - 15.0 g/dL 16.1 11.3(L) 11.5(L)  Hematocrit 36.0 - 46.0 % 42.7 34.2(L) 35.2(L)  Platelets 150 - 400 K/uL 209 205 190    CMP Latest Ref Rng & Units 05/12/2021 03/13/2021 03/12/2021  Glucose 70 - 99 mg/dL 096(E) 90 454(U)  BUN 8 - 23 mg/dL 11 10 10   Creatinine 0.44 - 1.00 mg/dL ) 9.81(X) 9.14(N)  Sodium 135 - 145 mmol/L 145 140 141   Potassium 3.5 - 5.1 mmol/L 4.5 4.5 4.4  Chloride 98 - 111 mmol/L 104 108 107  CO2 22 - 32 mmol/L 29 27 29   Calcium 8.9 - 10.3 mg/dL 8.29(F ) 62.1)  Total Protein 6.5 - 8.1 g/dL 7.1 - -  Total Bilirubin 0.3 - 1.2 mg/dL 2.2(H) - -  Alkaline Phos 38 - 126 U/L 89 - -  AST 15 - 41 U/L 26 - -  ALT 0 - 44 U/L 23 - -    RADIOGRAPHIC STUDIES: 3.0(Q BREAST LTD UNI LEFT INC AXILLA  Result Date: 05/18/2021 CLINICAL DATA:  Right breast masses seen on recent CT imaging. EXAM: DIGITAL DIAGNOSTIC BILATERAL MAMMOGRAM WITH TOMOSYNTHESIS AND CAD; ULTRASOUND RIGHT BREAST LIMITED; ULTRASOUND LEFT BREAST LIMITED TECHNIQUE: Bilateral digital diagnostic mammography and breast tomosynthesis was performed. The images were evaluated with computer-aided detection.; Targeted ultrasound examination of the right breast was performed; Targeted ultrasound examination of the left breast was performed COMPARISON:  None. ACR Breast Density Category b: There are scattered areas of fibroglandular density. FINDINGS: There are 2 adjacent right breast masses located laterally and mid depth with should correlate with mammographic findings. There is another adjacent mass located laterally and inferiorly to the 2 dominant masses. Asymmetries just superior and inferior to the nipple on the right MLO view resolves with additional imaging. An asymmetry just lateral to the nipple on the cc view on the left improves but does not completely resolve with additional imaging. An asymmetry just medial to the left nipple on the cc view persists on additional imaging. On physical exam, no suspicious lumps are identified. Targeted ultrasound is performed, showing 2 dominant cysts in the right breast at 10 o'clock and 11 o'clock accounting for the mammographically and CT identified masses. There is an indeterminate hypoechoic mass at 9 o'clock, 6 cm from the nipple likely correlating with the most laterally located mass seen mammographically. This mass  measures 6 by 3 x 3  mm. No abnormalities seen in the periareolar region on the right. There is a probably benign mass in the left breast at 4 o'clock likely correlating with the laterally located left breast asymmetry. This mass measures 6 by 7 x 2 mm. No abnormalities are seen medially to correlate with the second medially located asymmetry. IMPRESSION: Fibrocystic changes in the right breast accounting for CT findings. Probably benign mass in the right breast at 9 o'clock, 6 cm from the nipple likely correlating with the most laterally located right breast mass seen mammographically. Probably benign mass in the left breast at 4 o'clock likely correlating with the lateral asymmetry seen mammographically. There is no sonographic correlate for the medial left breast asymmetry which is also probably benign. RECOMMENDATION: Recommend six-month follow-up mammogram and ultrasound for the 9 o'clock right breast mass which is probably benign. Recommend six-month follow-up mammogram and ultrasound of the lateral left asymmetry likely correlating with the 4 o'clock left breast mass seen sonographically. Recommend six-month follow-up of the medial left breast asymmetry. I have discussed the findings and recommendations with the patient. If applicable, a reminder letter will be sent to the patient regarding the next appointment. BI-RADS CATEGORY  3: Probably benign. Electronically Signed   By: Gerome Sam III M.D   On: 05/18/2021 15:10  US BREAST LTD UNI RIGHT INC AXILLA  Result Date: 05/18/2021 CLINICAL DATA:  Right breast masses seen on recent CT imaging. EXAM: DIGITAL DIAGNOSTIC BILATERAL MAMMOGRAM WITH TOMOSYNTHESIS AND CAD; ULTRASOUND RIGHT BREAST LIMITED; ULTRASOUND LEFT BREAST LIMITED TECHNIQUE: Bilateral digital diagnostic mammography and breast tomosynthesis was performed. The images were evaluated with computer-aided detection.; Targeted ultrasound examination of the right breast was performed; Targeted  ultrasound examination of the left breast was performed COMPARISON:  None. ACR Breast Density Category b: There are scattered areas of fibroglandular density. FINDINGS: There are 2 adjacent right breast masses located laterally and mid depth with should correlate with mammographic findings. There is another adjacent mass located laterally and inferiorly to the 2 dominant masses. Asymmetries just superior and inferior to the nipple on the right MLO view resolves with additional imaging. An asymmetry just lateral to the nipple on the cc view on the left improves but does not completely resolve with additional imaging. An asymmetry just medial to the left nipple on the cc view persists on additional imaging. On physical exam, no suspicious lumps are identified. Targeted ultrasound is performed, showing 2 dominant cysts in the right breast at 10 o'clock and 11 o'clock accounting for the mammographically and CT identified masses. There is an indeterminate hypoechoic mass at 9 o'clock, 6 cm from the nipple likely correlating with the most laterally located mass seen mammographically. This mass measures 6 by 3 x 3 mm. No abnormalities seen in the periareolar region on the right. There is a probably benign mass in the left breast at 4 o'clock likely correlating with the laterally located left breast asymmetry. This mass measures 6 by 7 x 2 mm. No abnormalities are seen medially to correlate with the second medially located asymmetry. IMPRESSION: Fibrocystic changes in the right breast accounting for CT findings. Probably benign mass in the right breast at 9 o'clock, 6 cm from the nipple likely correlating with the most laterally located right breast mass seen mammographically. Probably benign mass in the left breast at 4 o'clock likely correlating with the lateral asymmetry seen mammographically. There is no sonographic correlate for the medial left breast asymmetry which is also probably benign. RECOMMENDATION: Recommend  six-month follow-up  mammogram and ultrasound for the 9 o'clock right breast mass which is probably benign. Recommend six-month follow-up mammogram and ultrasound of the lateral left asymmetry likely correlating with the 4 o'clock left breast mass seen sonographically. Recommend six-month follow-up of the medial left breast asymmetry. I have discussed the findings and recommendations with the patient. If applicable, a reminder letter will be sent to the patient regarding the next appointment. BI-RADS CATEGORY  3: Probably benign. Electronically Signed   By: Gerome Sam III M.D   On: 05/18/2021 15:10  MM DIAG BREAST TOMO BILATERAL  Result Date: 05/18/2021 CLINICAL DATA:  Right breast masses seen on recent CT imaging. EXAM: DIGITAL DIAGNOSTIC BILATERAL MAMMOGRAM WITH TOMOSYNTHESIS AND CAD; ULTRASOUND RIGHT BREAST LIMITED; ULTRASOUND LEFT BREAST LIMITED TECHNIQUE: Bilateral digital diagnostic mammography and breast tomosynthesis was performed. The images were evaluated with computer-aided detection.; Targeted ultrasound examination of the right breast was performed; Targeted ultrasound examination of the left breast was performed COMPARISON:  None. ACR Breast Density Category b: There are scattered areas of fibroglandular density. FINDINGS: There are 2 adjacent right breast masses located laterally and mid depth with should correlate with mammographic findings. There is another adjacent mass located laterally and inferiorly to the 2 dominant masses. Asymmetries just superior and inferior to the nipple on the right MLO view resolves with additional imaging. An asymmetry just lateral to the nipple on the cc view on the left improves but does not completely resolve with additional imaging. An asymmetry just medial to the left nipple on the cc view persists on additional imaging. On physical exam, no suspicious lumps are identified. Targeted ultrasound is performed, showing 2 dominant cysts in the right breast at 10  o'clock and 11 o'clock accounting for the mammographically and CT identified masses. There is an indeterminate hypoechoic mass at 9 o'clock, 6 cm from the nipple likely correlating with the most laterally located mass seen mammographically. This mass measures 6 by 3 x 3 mm. No abnormalities seen in the periareolar region on the right. There is a probably benign mass in the left breast at 4 o'clock likely correlating with the laterally located left breast asymmetry. This mass measures 6 by 7 x 2 mm. No abnormalities are seen medially to correlate with the second medially located asymmetry. IMPRESSION: Fibrocystic changes in the right breast accounting for CT findings. Probably benign mass in the right breast at 9 o'clock, 6 cm from the nipple likely correlating with the most laterally located right breast mass seen mammographically. Probably benign mass in the left breast at 4 o'clock likely correlating with the lateral asymmetry seen mammographically. There is no sonographic correlate for the medial left breast asymmetry which is also probably benign. RECOMMENDATION: Recommend six-month follow-up mammogram and ultrasound for the 9 o'clock right breast mass which is probably benign. Recommend six-month follow-up mammogram and ultrasound of the lateral left asymmetry likely correlating with the 4 o'clock left breast mass seen sonographically. Recommend six-month follow-up of the medial left breast asymmetry. I have discussed the findings and recommendations with the patient. If applicable, a reminder letter will be sent to the patient regarding the next appointment. BI-RADS CATEGORY  3: Probably benign. Electronically Signed   By: Gerome Sam III M.D   On: 05/18/2021 15:10   ASSESSMENT & PLAN Nicolette Bang 70 y.o. female with medical history significant for fibromyalgia, hypertension, hypothyroidism, scleroderma, Sjogren's syndrome, and chronic kidney disease who presents for evaluation of a saddle pulmonary  emboli.  After review of the labs, review  of the records, and discussion with the patient the patients findings are most consistent with a massive unprovoked saddle pulmonary embolism.  Given the unprovoked nature of this VTE and her prior history of VTE I would recommend that she continue on lifelong anticoagulation.  The recommendation would be for full-strength Eliquis 5 mg twice daily for at least 6 months time.  After that we could consider transition to maintenance Eliquis 2.5 mg twice daily.  As long as the patient is tolerating the medication well without too much financial toxicity we can continue this therapy.  If there were to be any issues with the medicine we recommend she reach out to our clinic prior to her follow-up visit at the 68-month mark which would be November 2022.  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  Due to this I would recommend indefinite lifelong anticoagulation. --Continue Eliquis 5 mg twice daily --If the patient develops bleeding, intolerance, or the medication is financially difficult to continue would recommend the patient reach out to our clinic for alternative options. --After the patient has completed 6 full months of full-strength anticoagulation therapy could offer her transition to maintenance Eliquis 2.5 mg twice daily --We will have the patient return to clinic in November 2022 to discuss maintenance anticoagulation.  Orders Placed This Encounter  Procedures   CBC with Differential (Cancer Center Only)    Standing Status:   Future    Number of Occurrences:   1    Standing Expiration Date:   05/12/2022   CMP (Cancer Center only)    Standing Status:   Future    Number of Occurrences:   1    Standing Expiration Date:   05/12/2022   Cardiolipin antibodies, IgG, IgM, IgA*    Standing Status:   Future    Number of Occurrences:   1    Standing  Expiration Date:   05/12/2022   Beta-2-glycoprotein i abs, IgG/M/A    Standing Status:   Future    Number of Occurrences:   1    Standing Expiration Date:   05/12/2022   Iron and TIBC    Standing Status:   Future    Number of Occurrences:   1    Standing Expiration Date:   05/12/2022   Ferritin    Standing Status:   Future    Number of Occurrences:   1    Standing Expiration Date:   05/12/2022    All questions were answered. The patient knows to call the clinic with any problems, questions or concerns.  A total of more than 60 minutes were spent on this encounter with face-to-face time and non-face-to-face time, including preparing to see the patient, ordering tests and/or medications, counseling the patient and coordination of care as outlined above.   Ulysees Barns, MD Department of Hematology/Oncology [facility] Phone: [phone] Pager: [phone] Email: [email or web address]  05/20/2021 6:08 PM

## 2021-05-14 LAB — BETA-2-GLYCOPROTEIN I ABS, IGG/M/A
Beta-2 Glyco I IgG: <9 GPI IgG units (ref 0–20)
Beta-2-Glycoprotein I IgA: <9 GPI IgA units (ref 0–25)
Beta-2-Glycoprotein I IgM: <9 GPI IgM units (ref 0–32)

## 2021-05-14 LAB — CARDIOLIPIN ANTIBODIES, IGG, IGM, IGA
Anticardiolipin IgA: <9 APL U/mL (ref 0–11)
Anticardiolipin IgG: <9 GPL U/mL (ref 0–14)
Anticardiolipin IgM: 9 MPL U/mL (ref 0–12)

## 2021-05-18 ENCOUNTER — Other Ambulatory Visit: Payer: Self-pay | Admitting: Family Medicine

## 2021-05-18 ENCOUNTER — Ambulatory Visit
Admission: RE | Admit: 2021-05-18 | Discharge: 2021-05-18 | Disposition: A | Discharge status: Home / Self Care | Payer: Medicare Other | Source: Ambulatory Visit | Attending: Family Medicine | Admitting: Family Medicine

## 2021-05-18 ENCOUNTER — Other Ambulatory Visit: Payer: Self-pay

## 2021-05-18 DIAGNOSIS — R922 Inconclusive mammogram: Secondary | ICD-10-CM | POA: Diagnosis not present

## 2021-05-18 DIAGNOSIS — N6489 Other specified disorders of breast: Secondary | ICD-10-CM | POA: Diagnosis not present

## 2021-05-18 DIAGNOSIS — N6310 Unspecified lump in the right breast, unspecified quadrant: Secondary | ICD-10-CM

## 2021-05-18 DIAGNOSIS — N63 Unspecified lump in unspecified breast: Secondary | ICD-10-CM

## 2021-05-18 DIAGNOSIS — N6323 Unspecified lump in the left breast, lower outer quadrant: Secondary | ICD-10-CM | POA: Diagnosis not present

## 2021-05-19 ENCOUNTER — Other Ambulatory Visit: Payer: Self-pay | Admitting: *Deleted

## 2021-05-23 ENCOUNTER — Telehealth: Payer: Self-pay | Admitting: Hematology and Oncology

## 2021-05-23 NOTE — Telephone Encounter (Signed)
Scheduled appts per 7/16 sch msg. Pt aware.  

## 2021-05-29 ENCOUNTER — Telehealth: Payer: Self-pay | Admitting: *Deleted

## 2021-05-29 NOTE — Telephone Encounter (Signed)
-----   Message from Jaci Standard, MD sent at 05/26/2021  4:23 PM EDT ----- Please let Mrs. Schiffman know that her bloodwork did not show any evidence of a clotting disorder. Encourage her to continue taking eliquis 5mg  BID. We will see her back in Nov 2022 to discuss decrease to maintenance dose.  ----- Message ----- From: Dec 2022, Lab In [place] Sent: 05/12/2021   2:47 PM EDT To: 07/13/2021, MD

## 2021-05-29 NOTE — Telephone Encounter (Signed)
TCT patient regarding recent lab Advised that her labs did not show any signs of a clotting disorder.  She is to continue her Eliquis 5 mg BID and we will see her back in November.  Pt voiced understanding

## 2021-08-03 DIAGNOSIS — H04123 Dry eye syndrome of bilateral lacrimal glands: Secondary | ICD-10-CM | POA: Diagnosis not present

## 2021-08-10 DIAGNOSIS — H04123 Dry eye syndrome of bilateral lacrimal glands: Secondary | ICD-10-CM | POA: Diagnosis not present

## 2021-08-23 DIAGNOSIS — H16143 Punctate keratitis, bilateral: Secondary | ICD-10-CM | POA: Diagnosis not present

## 2021-09-21 ENCOUNTER — Inpatient Hospital Stay (HOSPITAL_BASED_OUTPATIENT_CLINIC_OR_DEPARTMENT_OTHER): Payer: Medicare Other | Admitting: Hematology and Oncology

## 2021-09-21 ENCOUNTER — Other Ambulatory Visit: Payer: Self-pay | Admitting: Hematology and Oncology

## 2021-09-21 ENCOUNTER — Other Ambulatory Visit: Payer: Self-pay

## 2021-09-21 ENCOUNTER — Inpatient Hospital Stay: Payer: Medicare Other | Attending: Hematology and Oncology

## 2021-09-21 VITALS — BP 127/56 | HR 67 | Temp 97.4°F | Resp 18 | Wt 132.6 lb

## 2021-09-21 DIAGNOSIS — Z86711 Personal history of pulmonary embolism: Secondary | ICD-10-CM | POA: Insufficient documentation

## 2021-09-21 DIAGNOSIS — I129 Hypertensive chronic kidney disease with stage 1 through stage 4 chronic kidney disease, or unspecified chronic kidney disease: Secondary | ICD-10-CM | POA: Insufficient documentation

## 2021-09-21 DIAGNOSIS — Z7901 Long term (current) use of anticoagulants: Secondary | ICD-10-CM | POA: Diagnosis not present

## 2021-09-21 DIAGNOSIS — I2602 Saddle embolus of pulmonary artery with acute cor pulmonale: Secondary | ICD-10-CM

## 2021-09-21 DIAGNOSIS — N1831 Chronic kidney disease, stage 3a: Secondary | ICD-10-CM | POA: Diagnosis not present

## 2021-09-21 LAB — CMP (CANCER CENTER ONLY)
ALT: 42 U/L (ref 0–44)
AST: 26 U/L (ref 15–41)
Albumin: 4.1 g/dL (ref 3.5–5.0)
Alkaline Phosphatase: 75 U/L (ref 38–126)
Anion gap: 10 (ref 5–15)
BUN: 12 mg/dL (ref 8–23)
CO2: 29 mmol/L (ref 22–32)
Calcium: 9.6 mg/dL (ref 8.9–10.3)
Chloride: 105 mmol/L (ref 98–111)
Creatinine: 1.31 mg/dL — ABNORMAL HIGH (ref 0.44–1.00)
GFR, Estimated: 44 mL/min — ABNORMAL LOW (ref >60)
Glucose, Bld: 124 mg/dL — ABNORMAL HIGH (ref 70–99)
Potassium: 4.4 mmol/L (ref 3.5–5.1)
Sodium: 144 mmol/L (ref 135–145)
Total Bilirubin: 1.4 mg/dL — ABNORMAL HIGH (ref 0.3–1.2)
Total Protein: 6.7 g/dL (ref 6.5–8.1)

## 2021-09-21 LAB — CBC WITH DIFFERENTIAL (CANCER CENTER ONLY)
Abs Immature Granulocytes: 0.02 10*3/uL (ref 0.00–0.07)
Basophils Absolute: 0.1 10*3/uL (ref 0.0–0.1)
Basophils Relative: 1 %
Eosinophils Absolute: 0.1 10*3/uL (ref 0.0–0.5)
Eosinophils Relative: 2 %
HCT: 40 % (ref 36.0–46.0)
Hemoglobin: 13.4 g/dL (ref 12.0–15.0)
Immature Granulocytes: 0 %
Lymphocytes Relative: 21 %
Lymphs Abs: 1.2 10*3/uL (ref 0.7–4.0)
MCH: 32.4 pg (ref 26.0–34.0)
MCHC: 33.5 g/dL (ref 30.0–36.0)
MCV: 96.6 fL (ref 80.0–100.0)
Monocytes Absolute: 0.4 10*3/uL (ref 0.1–1.0)
Monocytes Relative: 7 %
Neutro Abs: 3.9 10*3/uL (ref 1.7–7.7)
Neutrophils Relative %: 69 %
Platelet Count: 178 10*3/uL (ref 150–400)
RBC: 4.14 MIL/uL (ref 3.87–5.11)
RDW: 13.1 % (ref 11.5–15.5)
WBC Count: 5.7 10*3/uL (ref 4.0–10.5)
nRBC: 0 % (ref 0.0–0.2)

## 2021-09-21 MED ORDER — APIXABAN 2.5 MG PO TABS: 2.5000 mg | ORAL_TABLET | Freq: Two times a day (BID) | ORAL | 2 refills | Status: DC

## 2021-09-21 NOTE — Progress Notes (Signed)
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 CTA pulmonary study showed large pulmonary embolus including central and peripheral saddle emboli with heart strain. Started on Eliquis 5 mg BID 05/12/2021: establish care with Dr. Leonides Schanz   Interval History:  Denise Weiss 70 y.o. female with medical history significant for saddle pulmonary embolis who presents for a follow up visit. The patient's last visit was on 05/12/2021. In the interim since the last visit she has continued on PO eliquis therapy 5mg  BID.  On exam today Denise Weiss reports that she has "been doing okay" in the interim since our last visit.  She reports that twice in the last 6 months she developed some bruising on her calf and a lump underneath it.  These have subsequently resolved and she does not have any active today.  She reports that overall she is able to afford the Eliquis medication without any difficulty.  She is having no signs or symptoms concerning for recurrent clot today.  She denies any chest pain, shortness of breath, lower extremity swelling, or erythema.  She reports that she is actively having a "flare of my autoimmune disease".  Has been having difficulty with dental issues as well.  Fortunately she has not required any dental procedures that require holding of the Eliquis therapy.  Interestingly she also notes that her daughter recently gave birth to [facility] and developed pulmonary emboli following the delivery.  She otherwise denies any fevers, chills, sweats, nausea, vomiting or diarrhea.  She denies any bleeding, bruising, or dark stools.  Full 10 point ROS is listed below.  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)    Scleroderma (HCC)    Sjogren's syndrome (HCC)    Stage 3a chronic kidney disease (HCC)    Tremor     SURGICAL HISTORY: Past Surgical History:  Procedure Laterality Date   CESAREAN SECTION     KNEE SURGERY     TONSILLECTOMY      SOCIAL HISTORY: Social History   Socioeconomic History   Marital status: Single    Spouse name: Not on file   Number of children: Not on file   Years of education: Not on file   Highest education level: Not on file  Occupational History   Occupation: retired  Tobacco Use   Smoking status: Never   Smokeless tobacco: Never  Vaping Use   Vaping Use: Never used  Substance and Sexual Activity   Alcohol use: Not Currently   Drug use: Never   Sexual activity: Not on file  Other Topics Concern   Not on file  Social History Narrative   Not on file   Social Determinants of Health   Financial Resource Strain: Not on file  Food Insecurity: Not on file  Transportation Needs: Not on file  Physical Activity: Not on file  Stress: Not on file  Social Connections: Not on file  Intimate Partner Violence: Not on file    FAMILY HISTORY: Family History  Problem Relation Age of Onset   Hepatitis C Mother  Heart attack Father    Schizophrenia Sister     ALLERGIES:  has No Known Allergies.  MEDICATIONS:  Current Outpatient Medications  Medication Sig Dispense Refill   apixaban (ELIQUIS) 2.5 MG TABS tablet Take 1 tablet (2.5 mg total) by mouth 2 (two) times daily. 180 tablet 2   ALPRAZolam (XANAX) 0.25 MG tablet Take 0.25-0.5 mg by mouth daily as needed for sleep.     ARIPiprazole (ABILIFY) 5 MG tablet Take 5 mg by mouth at bedtime.     atenolol (TENORMIN) 50 MG tablet Take 50 mg by mouth 2 (two) times daily.     atorvastatin (LIPITOR) 20 MG tablet Take 20 mg by mouth daily.     docusate sodium (COLACE)  100 MG capsule Take 1 capsule (100 mg total) by mouth 2 (two) times daily. (Patient not taking: Reported on 04/28/2021) 10 capsule 0   levothyroxine (SYNTHROID) 100 MCG tablet Take 100 mcg by mouth every morning.     losartan (COZAAR) 25 MG tablet Take 50 mg by mouth daily.     Multiple Vitamin (MULTIVITAMIN WITH MINERALS) TABS tablet Take 1 tablet by mouth daily.     omeprazole (PRILOSEC) 20 MG capsule Take 20 mg by mouth daily.     traMADol (ULTRAM) 50 MG tablet Take 100 mg by mouth every 12 (twelve) hours.     traZODone (DESYREL) 50 MG tablet Take 50 mg by mouth at bedtime. Take 1-3 tablets at bedtime as needed     triamcinolone cream (KENALOG) 0.1 % Apply 1 application topically 2 (two) times daily.     TRINTELLIX 5 MG TABS tablet Take 5 mg by mouth daily.     No current facility-administered medications for this visit.    REVIEW OF SYSTEMS:   Constitutional: ( - ) fevers, ( - )  chills , ( - ) night sweats Eyes: ( - ) blurriness of vision, ( - ) double vision, ( - ) watery eyes Ears, nose, mouth, throat, and face: ( - ) mucositis, ( - ) sore throat Respiratory: ( - ) cough, ( - ) dyspnea, ( - ) wheezes Cardiovascular: ( - ) palpitation, ( - ) chest discomfort, ( - ) lower extremity swelling Gastrointestinal:  ( - ) nausea, ( - ) heartburn, ( - ) change in bowel habits Skin: ( - ) abnormal skin rashes Lymphatics: ( - ) new lymphadenopathy, ( - ) easy bruising Neurological: ( - ) numbness, ( - ) tingling, ( - ) new weaknesses Behavioral/Psych: ( - ) mood change, ( - ) new changes  All other systems were reviewed with the patient and are negative.  PHYSICAL EXAMINATION: ECOG PERFORMANCE STATUS: 1 - Symptomatic but completely ambulatory  Vitals:   09/21/21 1142  BP: (!) 127/56  Pulse: 67  Resp: 18  Temp: (!) 97.4 F (36.3 C)  SpO2: 96%   Filed Weights   09/21/21 1142  Weight: 132 lb 9.6 oz (60.1 kg)    GENERAL: Well-appearing elderly Caucasian female, alert, no distress and  comfortable SKIN: skin color, texture, turgor are normal, no rashes or significant lesions EYES: conjunctiva are pink and non-injected, sclera clear LUNGS: clear to auscultation and percussion with normal breathing effort HEART: regular rate & rhythm and no murmurs and no lower extremity edema Musculoskeletal: no cyanosis of digits and no clubbing  PSYCH: alert & oriented x 3, fluent speech NEURO: no focal motor/sensory deficits  LABORATORY DATA:  I have reviewed the data as listed CBC Latest Ref Rng &  Units 09/21/2021 05/12/2021 03/13/2021  WBC 4.0 - 10.5 K/uL 5.7 7.4 6.5  Hemoglobin 12.0 - 15.0 g/dL 13.4 14.0 11.3(L)  Hematocrit 36.0 - 46.0 % 40.0 42.7 34.2(L)  Platelets 150 - 400 K/uL 178 209 205    CMP Latest Ref Rng & Units 09/21/2021 05/12/2021 03/13/2021  Glucose 70 - 99 mg/dL 124(H) 101(H) 90  BUN 8 - 23 mg/dL 12 11 10   Creatinine 0.44 - 1.00 mg/dL 1.31(H) 1.25(H) 1.10(H)  Sodium 135 - 145 mmol/L 144 145 140  Potassium 3.5 - 5.1 mmol/L 4.4 4.5 4.5  Chloride 98 - 111 mmol/L 105 104 108  CO2 22 - 32 mmol/L 29 29 27   Calcium 8.9 - 10.3 mg/dL 9.6 10.1 8.5(L)  Total Protein 6.5 - 8.1 g/dL 6.7 7.1 -  Total Bilirubin 0.3 - 1.2 mg/dL 1.4(H) 2.2(H) -  Alkaline Phos 38 - 126 U/L 75 89 -  AST 15 - 41 U/L 26 26 -  ALT 0 - 44 U/L 42 23 -    RADIOGRAPHIC STUDIES: No results found.  ASSESSMENT & PLAN TRACE HANKES 70 y.o. female with medical history significant for saddle pulmonary embolis who presents for a follow up visit.  After review of the labs, review of the records, and discussion with the patient the patients findings are most consistent with a massive unprovoked saddle pulmonary embolism.  Given the unprovoked nature of this VTE and her prior history of VTE I would recommend that she continue on lifelong anticoagulation.  The recommendation would be for full-strength Eliquis 5 mg twice daily for at least 6 months time.  After that she could consider transition to maintenance  Eliquis 2.5 mg twice daily.  We had a discussion on 09/21/2021 and she was agreeable to transitioning down to the lower dose of maintenance therapy.  As long as the patient is tolerating the medication well without too much financial difficulty we can continue this therapy.  If there were to be any issues with the medicine we recommend she reach out to our clinic prior to her follow-up visit in 39-months.  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  Due to this I would recommend indefinite lifelong anticoagulation. --Continue Eliquis 5 mg twice daily until prescription is out, then transition to maintenance dosing --Patient has completed 6 full months of full-strength anticoagulation therapy, she is agreeable to transition to maintenance Eliquis 2.5 mg twice daily --If the patient develops bleeding, intolerance, or the medication is financially difficult to continue would recommend the patient reach out to our clinic for alternative options. --We will have the patient return to clinic in 6 months time to assure tolerance of anticoagulation therapy.   No orders of the defined types were placed in this encounter.   All questions were answered. The patient knows to call the clinic with any problems, questions or concerns.  A total of more than 30 minutes were spent on this encounter with face-to-face time and non-face-to-face time, including preparing to see the patient, ordering tests and/or medications, counseling the patient and coordination of care as outlined above.   Ledell Peoples, MD Department of Hematology/Oncology [place] [facility] Phone: [phone] Pager: [phone] Email: [email or web address]  09/21/2021 12:47 PM

## 2021-10-23 DIAGNOSIS — R634 Abnormal weight loss: Secondary | ICD-10-CM | POA: Diagnosis not present

## 2021-10-23 DIAGNOSIS — E7800 Pure hypercholesterolemia, unspecified: Secondary | ICD-10-CM | POA: Diagnosis not present

## 2021-10-23 DIAGNOSIS — N393 Stress incontinence (female) (male): Secondary | ICD-10-CM | POA: Diagnosis not present

## 2021-10-23 DIAGNOSIS — Z1389 Encounter for screening for other disorder: Secondary | ICD-10-CM | POA: Diagnosis not present

## 2021-10-23 DIAGNOSIS — I129 Hypertensive chronic kidney disease with stage 1 through stage 4 chronic kidney disease, or unspecified chronic kidney disease: Secondary | ICD-10-CM | POA: Diagnosis not present

## 2021-10-23 DIAGNOSIS — Z86711 Personal history of pulmonary embolism: Secondary | ICD-10-CM | POA: Diagnosis not present

## 2021-10-23 DIAGNOSIS — N183 Chronic kidney disease, stage 3 unspecified: Secondary | ICD-10-CM | POA: Diagnosis not present

## 2021-10-23 DIAGNOSIS — E039 Hypothyroidism, unspecified: Secondary | ICD-10-CM | POA: Diagnosis not present

## 2021-10-23 DIAGNOSIS — Z23 Encounter for immunization: Secondary | ICD-10-CM | POA: Diagnosis not present

## 2021-10-23 DIAGNOSIS — M797 Fibromyalgia: Secondary | ICD-10-CM | POA: Diagnosis not present

## 2021-10-23 DIAGNOSIS — Z0000 Encounter for general adult medical examination without abnormal findings: Secondary | ICD-10-CM | POA: Diagnosis not present

## 2021-11-21 ENCOUNTER — Other Ambulatory Visit: Payer: Medicare Other

## 2021-11-28 DIAGNOSIS — R634 Abnormal weight loss: Secondary | ICD-10-CM | POA: Diagnosis not present

## 2021-12-19 DIAGNOSIS — I10 Essential (primary) hypertension: Secondary | ICD-10-CM | POA: Diagnosis not present

## 2021-12-19 DIAGNOSIS — F3181 Bipolar II disorder: Secondary | ICD-10-CM | POA: Diagnosis not present

## 2021-12-19 DIAGNOSIS — N183 Chronic kidney disease, stage 3 unspecified: Secondary | ICD-10-CM | POA: Diagnosis not present

## 2021-12-29 ENCOUNTER — Other Ambulatory Visit: Payer: Self-pay | Admitting: Family Medicine

## 2021-12-29 DIAGNOSIS — R9389 Abnormal findings on diagnostic imaging of other specified body structures: Secondary | ICD-10-CM

## 2021-12-30 ENCOUNTER — Other Ambulatory Visit: Payer: Self-pay | Admitting: Family Medicine

## 2021-12-30 DIAGNOSIS — R9389 Abnormal findings on diagnostic imaging of other specified body structures: Secondary | ICD-10-CM

## 2022-01-01 ENCOUNTER — Other Ambulatory Visit: Payer: Self-pay | Admitting: Family Medicine

## 2022-01-01 DIAGNOSIS — R9389 Abnormal findings on diagnostic imaging of other specified body structures: Secondary | ICD-10-CM

## 2022-01-12 ENCOUNTER — Ambulatory Visit
Admission: RE | Admit: 2022-01-12 | Discharge: 2022-01-12 | Disposition: A | Discharge status: Home / Self Care | Payer: Medicare Other | Source: Ambulatory Visit | Attending: Family Medicine | Admitting: Family Medicine

## 2022-01-12 DIAGNOSIS — N6320 Unspecified lump in the left breast, unspecified quadrant: Secondary | ICD-10-CM

## 2022-01-12 DIAGNOSIS — R922 Inconclusive mammogram: Secondary | ICD-10-CM | POA: Diagnosis not present

## 2022-01-12 DIAGNOSIS — N6310 Unspecified lump in the right breast, unspecified quadrant: Secondary | ICD-10-CM

## 2022-01-18 ENCOUNTER — Other Ambulatory Visit: Payer: Medicare Other

## 2022-01-18 ENCOUNTER — Ambulatory Visit
Admission: RE | Admit: 2022-01-18 | Discharge: 2022-01-18 | Disposition: A | Discharge status: Home / Self Care | Payer: Medicare Other | Source: Ambulatory Visit | Attending: Family Medicine | Admitting: Family Medicine

## 2022-01-18 DIAGNOSIS — K5710 Diverticulosis of small intestine without perforation or abscess without bleeding: Secondary | ICD-10-CM | POA: Diagnosis not present

## 2022-01-18 DIAGNOSIS — R918 Other nonspecific abnormal finding of lung field: Secondary | ICD-10-CM | POA: Diagnosis not present

## 2022-02-28 ENCOUNTER — Telehealth: Payer: Self-pay | Admitting: Hematology and Oncology

## 2022-02-28 NOTE — Telephone Encounter (Signed)
Called patient regarding upcoming appointment, left a voicemail. 

## 2022-03-07 ENCOUNTER — Telehealth: Payer: Self-pay | Admitting: Hematology and Oncology

## 2022-03-07 NOTE — Telephone Encounter (Signed)
R/s per provider sched, message has been left with pt ?

## 2022-03-21 ENCOUNTER — Inpatient Hospital Stay: Payer: Medicare Other

## 2022-03-21 ENCOUNTER — Inpatient Hospital Stay: Payer: Medicare Other | Admitting: Hematology and Oncology

## 2022-03-21 DIAGNOSIS — R935 Abnormal findings on diagnostic imaging of other abdominal regions, including retroperitoneum: Secondary | ICD-10-CM | POA: Diagnosis not present

## 2022-03-21 DIAGNOSIS — M797 Fibromyalgia: Secondary | ICD-10-CM | POA: Diagnosis not present

## 2022-03-21 DIAGNOSIS — I129 Hypertensive chronic kidney disease with stage 1 through stage 4 chronic kidney disease, or unspecified chronic kidney disease: Secondary | ICD-10-CM | POA: Diagnosis not present

## 2022-03-21 DIAGNOSIS — N183 Chronic kidney disease, stage 3 unspecified: Secondary | ICD-10-CM | POA: Diagnosis not present

## 2022-03-21 DIAGNOSIS — R634 Abnormal weight loss: Secondary | ICD-10-CM | POA: Diagnosis not present

## 2022-03-21 DIAGNOSIS — Z681 Body mass index (BMI) 19 or less, adult: Secondary | ICD-10-CM | POA: Diagnosis not present

## 2022-03-23 ENCOUNTER — Inpatient Hospital Stay: Payer: Medicare Other | Attending: Hematology and Oncology

## 2022-03-23 ENCOUNTER — Inpatient Hospital Stay (HOSPITAL_BASED_OUTPATIENT_CLINIC_OR_DEPARTMENT_OTHER): Payer: Medicare Other | Admitting: Hematology and Oncology

## 2022-03-23 ENCOUNTER — Other Ambulatory Visit: Payer: Self-pay | Admitting: Hematology and Oncology

## 2022-03-23 ENCOUNTER — Other Ambulatory Visit: Payer: Self-pay

## 2022-03-23 VITALS — BP 131/53 | HR 69 | Temp 97.5°F | Resp 16 | Wt 114.5 lb

## 2022-03-23 DIAGNOSIS — I129 Hypertensive chronic kidney disease with stage 1 through stage 4 chronic kidney disease, or unspecified chronic kidney disease: Secondary | ICD-10-CM | POA: Diagnosis not present

## 2022-03-23 DIAGNOSIS — Z7901 Long term (current) use of anticoagulants: Secondary | ICD-10-CM | POA: Insufficient documentation

## 2022-03-23 DIAGNOSIS — N1831 Chronic kidney disease, stage 3a: Secondary | ICD-10-CM | POA: Insufficient documentation

## 2022-03-23 DIAGNOSIS — Z86711 Personal history of pulmonary embolism: Secondary | ICD-10-CM | POA: Diagnosis not present

## 2022-03-23 DIAGNOSIS — K769 Liver disease, unspecified: Secondary | ICD-10-CM

## 2022-03-23 DIAGNOSIS — I2602 Saddle embolus of pulmonary artery with acute cor pulmonale: Secondary | ICD-10-CM

## 2022-03-23 DIAGNOSIS — R932 Abnormal findings on diagnostic imaging of liver and biliary tract: Secondary | ICD-10-CM | POA: Diagnosis not present

## 2022-03-23 LAB — CBC WITH DIFFERENTIAL (CANCER CENTER ONLY)
Abs Immature Granulocytes: 0.02 10*3/uL (ref 0.00–0.07)
Basophils Absolute: 0 10*3/uL (ref 0.0–0.1)
Basophils Relative: 1 %
Eosinophils Absolute: 0.1 10*3/uL (ref 0.0–0.5)
Eosinophils Relative: 2 %
HCT: 39.8 % (ref 36.0–46.0)
Hemoglobin: 13.4 g/dL (ref 12.0–15.0)
Immature Granulocytes: 0 %
Lymphocytes Relative: 20 %
Lymphs Abs: 1.3 10*3/uL (ref 0.7–4.0)
MCH: 32.8 pg (ref 26.0–34.0)
MCHC: 33.7 g/dL (ref 30.0–36.0)
MCV: 97.3 fL (ref 80.0–100.0)
Monocytes Absolute: 0.3 10*3/uL (ref 0.1–1.0)
Monocytes Relative: 5 %
Neutro Abs: 4.6 10*3/uL (ref 1.7–7.7)
Neutrophils Relative %: 72 %
Platelet Count: 246 10*3/uL (ref 150–400)
RBC: 4.09 MIL/uL (ref 3.87–5.11)
RDW: 13.4 % (ref 11.5–15.5)
WBC Count: 6.4 10*3/uL (ref 4.0–10.5)
nRBC: 0 % (ref 0.0–0.2)

## 2022-03-23 LAB — CMP (CANCER CENTER ONLY)
ALT: 25 U/L (ref 0–44)
AST: 21 U/L (ref 15–41)
Albumin: 4.2 g/dL (ref 3.5–5.0)
Alkaline Phosphatase: 59 U/L (ref 38–126)
Anion gap: 9 (ref 5–15)
BUN: 11 mg/dL (ref 8–23)
CO2: 29 mmol/L (ref 22–32)
Calcium: 9.5 mg/dL (ref 8.9–10.3)
Chloride: 102 mmol/L (ref 98–111)
Creatinine: 1.21 mg/dL — ABNORMAL HIGH (ref 0.44–1.00)
GFR, Estimated: 48 mL/min — ABNORMAL LOW (ref >60)
Glucose, Bld: 128 mg/dL — ABNORMAL HIGH (ref 70–99)
Potassium: 4.5 mmol/L (ref 3.5–5.1)
Sodium: 140 mmol/L (ref 135–145)
Total Bilirubin: 1.3 mg/dL — ABNORMAL HIGH (ref 0.3–1.2)
Total Protein: 6.8 g/dL (ref 6.5–8.1)

## 2022-03-23 NOTE — Progress Notes (Signed)
[place] Telephone:([phone]   Fax:([phone]  PROGRESS NOTE  Patient Care Team: Kathyrn Lass, MD as PCP - General (Family Medicine)  Hematological/Oncological History # Saddle Pulmonary Embolism 03/10/2021: presented to the ED with left sided rib pain. CTA pulmonary study showed large pulmonary embolus including central and peripheral saddle emboli with heart strain. Started on Eliquis 5 mg BID 05/12/2021: establish care with Dr. Lorenso Courier  09/21/2022: transition to maintenance dose eliquis 2.5 mg BID   Interval History:  Denise Weiss 71 y.o. female with medical history significant for saddle pulmonary embolis who presents for a follow up visit. The patient's last visit was on 09/21/2021. In the interim since the last visit she has continued on PO eliquis therapy 2.5 mg BID.  On exam today Denise Weiss reports she has been on a "roller coaster ride" since November.  She reports that she has lost 20 pounds and she is losing so much weight that is uncomfortable to sit because she feels her bones on the seating.  She reports that her PCP ordered a CT chest abdomen pelvis and there is some concern for a liver lesion.  She reports that her hair is falling out and she is also having ringing of the ears as well as weakness.  She notes a reticular pattern developing on her arms and legs bilaterally.  She stopped taking tramadol for pain as it was beginning to cause her "delusions".  She notes that she would not want any chemotherapy if it was required due to the results of her CT scan.  She notes that her pain is currently well controlled with Tylenol and she is going to be avoiding tramadol.  She notes that she continues to take the Eliquis without any bleeding, bruising, or dark stools.  She otherwise denies any fevers, chills, sweats, nausea, vomiting or diarrhea.  She denies any bleeding, bruising, or dark stools.  Full 10 point ROS is listed below.  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)    Scleroderma (HCC)    Sjogren's syndrome (HCC)    Stage 3a chronic kidney disease (HCC)    Tremor     SURGICAL HISTORY: Past Surgical History:  Procedure Laterality Date   CESAREAN SECTION     KNEE SURGERY     TONSILLECTOMY      SOCIAL HISTORY: Social History   Socioeconomic History   Marital status: Single    Spouse name: Not on file   Number of children: Not on file   Years of education: Not on file   Highest education level: Not on file  Occupational History   Occupation: retired  Tobacco Use   Smoking status: Never   Smokeless tobacco: Never  Vaping Use   Vaping Use: Never used  Substance and Sexual Activity   Alcohol use: Not Currently   Drug use: Never   Sexual activity: Not on file  Other Topics Concern   Not on file  Social History Narrative   Not on file   Social Determinants of Health   Financial Resource Strain: Not on file  Food Insecurity: Not on file  Transportation Needs: Not on file  Physical Activity: Not on file  Stress: Not on file  Social Connections: Not on  file  Intimate Partner Violence: Not on file    FAMILY HISTORY: Family History  Problem Relation Age of Onset   Hepatitis C Mother    Heart attack Father    Schizophrenia Sister     ALLERGIES:  has No Known Allergies.  MEDICATIONS:  Current Outpatient Medications  Medication Sig Dispense Refill   ALPRAZolam (XANAX) 0.25 MG tablet Take 0.25-0.5 mg by mouth daily as needed for sleep.     apixaban (ELIQUIS) 2.5 MG TABS tablet Take 1 tablet (2.5 mg total) by mouth 2 (two) times daily. 180 tablet 2   ARIPiprazole (ABILIFY) 5 MG tablet Take 5 mg by mouth at bedtime.     atenolol (TENORMIN) 50 MG tablet Take 50 mg by mouth 2 (two) times daily.     atorvastatin  (LIPITOR) 20 MG tablet Take 20 mg by mouth daily.     docusate sodium (COLACE) 100 MG capsule Take 1 capsule (100 mg total) by mouth 2 (two) times daily. (Patient not taking: Reported on 04/28/2021) 10 capsule 0   levothyroxine (SYNTHROID) 100 MCG tablet Take 100 mcg by mouth every morning.     losartan (COZAAR) 25 MG tablet Take 50 mg by mouth daily.     Multiple Vitamin (MULTIVITAMIN WITH MINERALS) TABS tablet Take 1 tablet by mouth daily.     omeprazole (PRILOSEC) 20 MG capsule Take 20 mg by mouth daily.     traMADol (ULTRAM) 50 MG tablet Take 100 mg by mouth every 12 (twelve) hours.     traZODone (DESYREL) 50 MG tablet Take 50 mg by mouth at bedtime. Take 1-3 tablets at bedtime as needed     triamcinolone cream (KENALOG) 0.1 % Apply 1 application topically 2 (two) times daily.     TRINTELLIX 5 MG TABS tablet Take 5 mg by mouth daily.     No current facility-administered medications for this visit.    REVIEW OF SYSTEMS:   Constitutional: ( - ) fevers, ( - )  chills , ( - ) night sweats Eyes: ( - ) blurriness of vision, ( - ) double vision, ( - ) watery eyes Ears, nose, mouth, throat, and face: ( - ) mucositis, ( - ) sore throat Respiratory: ( - ) cough, ( - ) dyspnea, ( - ) wheezes Cardiovascular: ( - ) palpitation, ( - ) chest discomfort, ( - ) lower extremity swelling Gastrointestinal:  ( - ) nausea, ( - ) heartburn, ( - ) change in bowel habits Skin: ( - ) abnormal skin rashes Lymphatics: ( - ) new lymphadenopathy, ( - ) easy bruising Neurological: ( - ) numbness, ( - ) tingling, ( - ) new weaknesses Behavioral/Psych: ( - ) mood change, ( - ) new changes  All other systems were reviewed with the patient and are negative.  PHYSICAL EXAMINATION: ECOG PERFORMANCE STATUS: 1 - Symptomatic but completely ambulatory  Vitals:   03/23/22 1441  BP: (!) 131/53  Pulse: 69  Resp: 16  Temp: (!) 97.5 F (36.4 C)  SpO2: 97%   Filed Weights   03/23/22 1441  Weight: 114 lb 8 oz (51.9 kg)     GENERAL: Well-appearing elderly Caucasian female, alert, no distress and comfortable SKIN: skin color, texture, turgor are normal, no rashes or significant lesions EYES: conjunctiva are pink and non-injected, sclera clear LUNGS: clear to auscultation and percussion with normal breathing effort HEART: regular rate & rhythm and no murmurs and no lower extremity edema Musculoskeletal: no cyanosis of digits and no clubbing  PSYCH: alert & oriented x 3, fluent speech NEURO: no focal motor/sensory deficits  LABORATORY DATA:  I have reviewed the data as listed    Latest Ref Rng & Units 03/23/2022    2:17 PM 09/21/2021   10:53 AM 05/12/2021    2:35 PM  CBC  WBC 4.0 - 10.5 K/uL 6.4   5.7   7.4    Hemoglobin 12.0 - 15.0 g/dL 13.4   13.4   14.0    Hematocrit 36.0 - 46.0 % 39.8   40.0   42.7    Platelets 150 - 400 K/uL 246   178   209         Latest Ref Rng & Units 03/23/2022    2:17 PM 09/21/2021   10:53 AM 05/12/2021    2:35 PM  CMP  Glucose 70 - 99 mg/dL 128   124   101    BUN 8 - 23 mg/dL 11   12   11     Creatinine 0.44 - 1.00 mg/dL 1.21   1.31   1.25    Sodium 135 - 145 mmol/L 140   144   145    Potassium 3.5 - 5.1 mmol/L 4.5   4.4   4.5    Chloride 98 - 111 mmol/L 102   105   104    CO2 22 - 32 mmol/L 29   29   29     Calcium 8.9 - 10.3 mg/dL 9.5   9.6   10.1    Total Protein 6.5 - 8.1 g/dL 6.8   6.7   7.1    Total Bilirubin 0.3 - 1.2 mg/dL 1.3   1.4   2.2    Alkaline Phos 38 - 126 U/L 59   75   89    AST 15 - 41 U/L 21   26   26     ALT 0 - 44 U/L 25   42   23      RADIOGRAPHIC STUDIES: No results found.  ASSESSMENT & PLAN KAREL GOULET 71 y.o. female with medical history significant for saddle pulmonary embolis who presents for a follow up visit.  After review of the labs, review of the records, and discussion with the patient the patients findings are most consistent with a massive unprovoked saddle pulmonary embolism.  Given the unprovoked nature of this VTE and her  prior history of VTE I would recommend that she continue on lifelong anticoagulation.  The recommendation would be for full-strength Eliquis 5 mg twice daily for at least 6 months time.  After that she could consider transition to maintenance Eliquis 2.5 mg twice daily.  We had a discussion on 09/21/2021 and she was agreeable to transitioning down to the lower dose of maintenance therapy.  As long as the patient is tolerating the medication well without too much financial difficulty we can continue this therapy.  If there were to be any issues with the medicine we recommend she reach out to our clinic prior to her follow-up visit in 31-months.  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  Due to this I would recommend indefinite lifelong anticoagulation. --Continue Eliquis 2.5 mg twice daily for thromboprophylaxis. --Patient has completed 6 full months of full-strength anticoagulation therapy, she was agreeable to transition to maintenance Eliquis 2.5 mg twice daily --If the patient develops bleeding, intolerance, or the medication is financially  difficult to continue would recommend the patient reach out to our clinic for alternative options. --We will have the patient return to clinic in 6 months time to assure tolerance of anticoagulation therapy.   # Liver Lesion # Weight Loss # Possible Metastatic Disease -- Noted on prior CT scan --I encourage patient to pursue an MRI liver in order to further assess.  She was agreeable to this testing.  Orders Placed This Encounter  Procedures   MR LIVER W WO CONTRAST    Standing Status:   Future    Standing Expiration Date:   04/02/2023    Order Specific Question:   If indicated for the ordered procedure, I authorize the administration of contrast media per Radiology protocol    Answer:   Yes    Order Specific Question:   What is the patient's  sedation requirement?    Answer:   No Sedation    Order Specific Question:   Does the patient have a pacemaker or implanted devices?    Answer:   No    Order Specific Question:   Preferred imaging location?    Answer:   [facility] (table limit - 550 lbs)   All questions were answered. The patient knows to call the clinic with any problems, questions or concerns.  A total of more than 30 minutes were spent on this encounter with face-to-face time and non-face-to-face time, including preparing to see the patient, ordering tests and/or medications, counseling the patient and coordination of care as outlined above.   Ledell Peoples, MD Department of Hematology/Oncology [place] [facility] Phone: [phone] Pager: [phone] Email: [email or web address]  04/01/2022 7:06 PM

## 2022-07-06 DIAGNOSIS — Z86711 Personal history of pulmonary embolism: Secondary | ICD-10-CM | POA: Diagnosis not present

## 2022-07-06 DIAGNOSIS — F3181 Bipolar II disorder: Secondary | ICD-10-CM | POA: Diagnosis not present

## 2022-07-06 DIAGNOSIS — E7800 Pure hypercholesterolemia, unspecified: Secondary | ICD-10-CM | POA: Diagnosis not present

## 2022-07-06 DIAGNOSIS — R9389 Abnormal findings on diagnostic imaging of other specified body structures: Secondary | ICD-10-CM | POA: Diagnosis not present

## 2022-07-06 DIAGNOSIS — M349 Systemic sclerosis, unspecified: Secondary | ICD-10-CM | POA: Diagnosis not present

## 2022-07-06 DIAGNOSIS — N183 Chronic kidney disease, stage 3 unspecified: Secondary | ICD-10-CM | POA: Diagnosis not present

## 2022-07-06 DIAGNOSIS — Z6820 Body mass index (BMI) 20.0-20.9, adult: Secondary | ICD-10-CM | POA: Diagnosis not present

## 2022-07-06 DIAGNOSIS — I700 Atherosclerosis of aorta: Secondary | ICD-10-CM | POA: Diagnosis not present

## 2022-07-06 DIAGNOSIS — E039 Hypothyroidism, unspecified: Secondary | ICD-10-CM | POA: Diagnosis not present

## 2022-07-10 ENCOUNTER — Other Ambulatory Visit: Payer: Self-pay | Admitting: Hematology and Oncology

## 2022-10-04 ENCOUNTER — Inpatient Hospital Stay: Payer: Medicare Other | Admitting: Hematology and Oncology

## 2022-10-04 ENCOUNTER — Inpatient Hospital Stay: Payer: Medicare Other

## 2022-10-21 NOTE — Progress Notes (Unsigned)
[facility] Telephone:(336) [phone]   [phone]  PROGRESS NOTE  Patient Care Team: [name], MD as PCP - General (Family Medicine)  Hematological/Oncological History # Saddle Pulmonary Embolism 03/10/2021: presented to the ED with left sided rib pain. CTA pulmonary study showed large pulmonary embolus including central and peripheral saddle emboli with heart strain. Started on Eliquis 5 mg BID 05/12/2021: establish care with Dr. Leonides Schanz  09/21/2022: transition to maintenance dose eliquis 2.5 mg BID   Interval History:  Denise Weiss 71 y.o. female with medical history significant for saddle pulmonary embolis who presents for a follow up visit. The patient's last visit was on 03/23/2022. In the interim since the last visit she has continued on PO eliquis therapy 2.5 mg BID.  On exam today Denise Weiss reports ***.  She otherwise denies any fevers, chills, sweats, nausea, vomiting or diarrhea.  She denies any bleeding, bruising, or dark stools.  Full 10 point ROS is listed below.  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)    Scleroderma (HCC)    Sjogren's syndrome (HCC)    Stage 3a chronic kidney disease (HCC)    Tremor     SURGICAL HISTORY: Past Surgical History:  Procedure Laterality Date   CESAREAN SECTION     KNEE SURGERY     TONSILLECTOMY      SOCIAL HISTORY: Social History   Socioeconomic History   Marital status: Single    Spouse name: Not on file   Number of children: Not on file   Years of education: Not on file   Highest education level: Not on file  Occupational History   Occupation: retired  Tobacco Use   Smoking status: Never   Smokeless tobacco: Never  Vaping Use   Vaping Use: Never used  Substance and Sexual Activity   Alcohol  use: Not Currently   Drug use: Never   Sexual activity: Not on file  Other Topics Concern   Not on file  Social History Narrative   Not on file   Social Determinants of Health   Financial Resource Strain: Not on file  Food Insecurity: Not on file  Transportation Needs: Not on file  Physical Activity: Not on file  Stress: Not on file  Social Connections: Not on file  Intimate Partner Violence: Not on file    FAMILY HISTORY: Family History  Problem Relation Age of Onset   Hepatitis C Mother    Heart attack Father    Schizophrenia Sister     ALLERGIES:  has No Known Allergies.  MEDICATIONS:  Current Outpatient Medications  Medication Sig Dispense Refill   ALPRAZolam (XANAX) 0.25 MG tablet Take 0.25-0.5 mg by mouth daily as needed for sleep.     ARIPiprazole (ABILIFY) 5 MG tablet Take 5 mg by mouth at bedtime.     atenolol (TENORMIN) 50 MG tablet Take 50 mg by mouth 2 (two) times daily.     atorvastatin (LIPITOR) 20 MG tablet Take 20 mg by mouth daily.     docusate sodium (COLACE) 100 MG capsule Take 1 capsule (100 mg total) by mouth 2 (two) times daily. (Patient not taking: Reported on 04/28/2021) 10 capsule 0   ELIQUIS 2.5 MG TABS tablet TAKE 1 TABLET(2.5 MG) BY MOUTH  TWICE DAILY 180 tablet 2   levothyroxine (SYNTHROID) 100 MCG tablet Take 100 mcg by mouth every morning.     losartan (COZAAR) 25 MG tablet Take 50 mg by mouth daily.     Multiple Vitamin (MULTIVITAMIN WITH MINERALS) TABS tablet Take 1 tablet by mouth daily.     omeprazole (PRILOSEC) 20 MG capsule Take 20 mg by mouth daily.     traMADol (ULTRAM) 50 MG tablet Take 100 mg by mouth every 12 (twelve) hours.     traZODone (DESYREL) 50 MG tablet Take 50 mg by mouth at bedtime. Take 1-3 tablets at bedtime as needed     triamcinolone cream (KENALOG) 0.1 % Apply 1 application topically 2 (two) times daily.     TRINTELLIX 5 MG TABS tablet Take 5 mg by mouth daily.     No current facility-administered medications for  this visit.    REVIEW OF SYSTEMS:   Constitutional: ( - ) fevers, ( - )  chills , ( - ) night sweats Eyes: ( - ) blurriness of vision, ( - ) double vision, ( - ) watery eyes Ears, nose, mouth, throat, and face: ( - ) mucositis, ( - ) sore throat Respiratory: ( - ) cough, ( - ) dyspnea, ( - ) wheezes Cardiovascular: ( - ) palpitation, ( - ) chest discomfort, ( - ) lower extremity swelling Gastrointestinal:  ( - ) nausea, ( - ) heartburn, ( - ) change in bowel habits Skin: ( - ) abnormal skin rashes Lymphatics: ( - ) new lymphadenopathy, ( - ) easy bruising Neurological: ( - ) numbness, ( - ) tingling, ( - ) new weaknesses Behavioral/Psych: ( - ) mood change, ( - ) new changes  All other systems were reviewed with the patient and are negative.  PHYSICAL EXAMINATION: ECOG PERFORMANCE STATUS: 1 - Symptomatic but completely ambulatory  There were no vitals filed for this visit.  There were no vitals filed for this visit.   GENERAL: Well-appearing elderly Caucasian female, alert, no distress and comfortable SKIN: skin color, texture, turgor are normal, no rashes or significant lesions EYES: conjunctiva are pink and non-injected, sclera clear LUNGS: clear to auscultation and percussion with normal breathing effort HEART: regular rate & rhythm and no murmurs and no lower extremity edema Musculoskeletal: no cyanosis of digits and no clubbing  PSYCH: alert & oriented x 3, fluent speech NEURO: no focal motor/sensory deficits  LABORATORY DATA:  I have reviewed the data as listed    Latest Ref Rng & Units 03/23/2022    2:17 PM 09/21/2021   10:53 AM 05/12/2021    2:35 PM  CBC  WBC 4.0 - 10.5 K/uL 6.4  5.7  7.4   Hemoglobin 12.0 - 15.0 g/dL 32.1  22.4  82.5   Hematocrit 36.0 - 46.0 % 39.8  40.0  42.7   Platelets 150 - 400 K/uL 246  178  209        Latest Ref Rng & Units 03/23/2022    2:17 PM 09/21/2021   10:53 AM 05/12/2021    2:35 PM  CMP  Glucose 70 - 99 mg/dL 003  704  888   BUN 8  - 23 mg/dL 11  12  11    Creatinine 0.44 - 1.00 mg/dL  9.16  9.45   Sodium 135 - 145 mmol/L 140  144  145   Potassium 3.5 - 5.1 mmol/L 4.5  4.4  4.5   Chloride 98 - 111 mmol/L 102  105  104   CO2 22 - 32 mmol/L 29  29  29    Calcium 8.9 - 10.3 mg/dL 9.5  9.6    Total Protein 6.5 - 8.1 g/dL 6.8  6.7  7.1   Total Bilirubin 0.3 - 1.2 mg/dL 1.3  1.4  2.2   Alkaline Phos 38 - 126 U/L 59  75  89   AST 15 - 41 U/L 21  26  26    ALT 0 - 44 U/L 25  42  23     RADIOGRAPHIC STUDIES: No results found.  ASSESSMENT & PLAN Denise Weiss 71 y.o. female with medical history significant for saddle pulmonary embolis who presents for a follow up visit.  After review of the labs, review of the records, and discussion with the patient the patients findings are most consistent with a massive unprovoked saddle pulmonary embolism.  Given the unprovoked nature of this VTE and her prior history of VTE I would recommend that she continue on lifelong anticoagulation.  The recommendation would be for full-strength Eliquis 5 mg twice daily for at least 6 months time.  After that she could consider transition to maintenance Eliquis 2.5 mg twice daily.  We had a discussion on 09/21/2021 and she was agreeable to transitioning down to the lower dose of maintenance therapy.  As long as the patient is tolerating the medication well without too much financial difficulty we can continue this therapy.  If there were to be any issues with the medicine we recommend she reach out to our clinic prior to her follow-up visit in 59-months.  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  Due to this I would recommend indefinite lifelong anticoagulation. --Continue Eliquis 2.5 mg twice daily for thromboprophylaxis. --Patient has completed 6 full months of full-strength anticoagulation therapy, she was agreeable to transition to  maintenance Eliquis 2.5 mg twice daily --If the patient develops bleeding, intolerance, or the medication is financially difficult to continue would recommend the patient reach out to our clinic for alternative options. --We will have the patient return to clinic in 6 months time to assure tolerance of anticoagulation therapy.   # Liver Lesion # Weight Loss # Possible Metastatic Disease -- Noted on prior CT scan --I encourage patient to pursue an MRI liver in order to further assess.  She was agreeable to this testing.  No orders of the defined types were placed in this encounter.  All questions were answered. The patient knows to call the clinic with any problems, questions or concerns.  A total of more than 30 minutes were spent on this encounter with face-to-face time and non-face-to-face time, including preparing to see the patient, ordering tests and/or medications, counseling the patient and coordination of care as outlined above.   8-month, MD Department of Hematology/Oncology [facility] at [facility] Phone: [phone] Pager: [phone] Email: [email or web address]  10/21/2022 3:57 PM

## 2022-10-22 ENCOUNTER — Inpatient Hospital Stay (HOSPITAL_BASED_OUTPATIENT_CLINIC_OR_DEPARTMENT_OTHER): Payer: Medicare Other | Admitting: Hematology and Oncology

## 2022-10-22 ENCOUNTER — Other Ambulatory Visit: Payer: Self-pay

## 2022-10-22 ENCOUNTER — Other Ambulatory Visit: Payer: Self-pay | Admitting: *Deleted

## 2022-10-22 ENCOUNTER — Inpatient Hospital Stay: Payer: Medicare Other | Attending: Hematology and Oncology

## 2022-10-22 VITALS — BP 131/53 | HR 63 | Temp 97.5°F | Resp 16 | Wt 131.0 lb

## 2022-10-22 DIAGNOSIS — R634 Abnormal weight loss: Secondary | ICD-10-CM | POA: Insufficient documentation

## 2022-10-22 DIAGNOSIS — I129 Hypertensive chronic kidney disease with stage 1 through stage 4 chronic kidney disease, or unspecified chronic kidney disease: Secondary | ICD-10-CM | POA: Insufficient documentation

## 2022-10-22 DIAGNOSIS — I2602 Saddle embolus of pulmonary artery with acute cor pulmonale: Secondary | ICD-10-CM | POA: Diagnosis not present

## 2022-10-22 DIAGNOSIS — N1831 Chronic kidney disease, stage 3a: Secondary | ICD-10-CM | POA: Diagnosis not present

## 2022-10-22 DIAGNOSIS — D500 Iron deficiency anemia secondary to blood loss (chronic): Secondary | ICD-10-CM

## 2022-10-22 DIAGNOSIS — Z7901 Long term (current) use of anticoagulants: Secondary | ICD-10-CM | POA: Insufficient documentation

## 2022-10-22 DIAGNOSIS — Z86711 Personal history of pulmonary embolism: Secondary | ICD-10-CM | POA: Diagnosis not present

## 2022-10-22 DIAGNOSIS — K769 Liver disease, unspecified: Secondary | ICD-10-CM | POA: Insufficient documentation

## 2022-10-22 LAB — CMP (CANCER CENTER ONLY)
ALT: 17 U/L (ref 0–44)
AST: 17 U/L (ref 15–41)
Albumin: 4.2 g/dL (ref 3.5–5.0)
Alkaline Phosphatase: 66 U/L (ref 38–126)
Anion gap: 4 — ABNORMAL LOW (ref 5–15)
BUN: 16 mg/dL (ref 8–23)
CO2: 29 mmol/L (ref 22–32)
Calcium: 9.7 mg/dL (ref 8.9–10.3)
Chloride: 104 mmol/L (ref 98–111)
Creatinine: 1.22 mg/dL — ABNORMAL HIGH (ref 0.44–1.00)
GFR, Estimated: 47 mL/min — ABNORMAL LOW (ref >60)
Glucose, Bld: 108 mg/dL — ABNORMAL HIGH (ref 70–99)
Potassium: 4.7 mmol/L (ref 3.5–5.1)
Sodium: 137 mmol/L (ref 135–145)
Total Bilirubin: 0.8 mg/dL (ref 0.3–1.2)
Total Protein: 6.3 g/dL — ABNORMAL LOW (ref 6.5–8.1)

## 2022-10-22 LAB — CBC WITH DIFFERENTIAL (CANCER CENTER ONLY)
Abs Immature Granulocytes: 0.02 10*3/uL (ref 0.00–0.07)
Basophils Absolute: 0 10*3/uL (ref 0.0–0.1)
Basophils Relative: 1 %
Eosinophils Absolute: 0.1 10*3/uL (ref 0.0–0.5)
Eosinophils Relative: 3 %
HCT: 39.7 % (ref 36.0–46.0)
Hemoglobin: 13.6 g/dL (ref 12.0–15.0)
Immature Granulocytes: 0 %
Lymphocytes Relative: 33 %
Lymphs Abs: 1.8 10*3/uL (ref 0.7–4.0)
MCH: 33.7 pg (ref 26.0–34.0)
MCHC: 34.3 g/dL (ref 30.0–36.0)
MCV: 98.5 fL (ref 80.0–100.0)
Monocytes Absolute: 0.4 10*3/uL (ref 0.1–1.0)
Monocytes Relative: 8 %
Neutro Abs: 3.1 10*3/uL (ref 1.7–7.7)
Neutrophils Relative %: 55 %
Platelet Count: 213 10*3/uL (ref 150–400)
RBC: 4.03 MIL/uL (ref 3.87–5.11)
RDW: 12.4 % (ref 11.5–15.5)
WBC Count: 5.5 10*3/uL (ref 4.0–10.5)
nRBC: 0 % (ref 0.0–0.2)

## 2022-11-07 DIAGNOSIS — E559 Vitamin D deficiency, unspecified: Secondary | ICD-10-CM | POA: Diagnosis not present

## 2022-11-07 DIAGNOSIS — Z23 Encounter for immunization: Secondary | ICD-10-CM | POA: Diagnosis not present

## 2022-11-07 DIAGNOSIS — I129 Hypertensive chronic kidney disease with stage 1 through stage 4 chronic kidney disease, or unspecified chronic kidney disease: Secondary | ICD-10-CM | POA: Diagnosis not present

## 2022-11-07 DIAGNOSIS — F3181 Bipolar II disorder: Secondary | ICD-10-CM | POA: Diagnosis not present

## 2022-11-07 DIAGNOSIS — N3281 Overactive bladder: Secondary | ICD-10-CM | POA: Diagnosis not present

## 2022-11-07 DIAGNOSIS — E039 Hypothyroidism, unspecified: Secondary | ICD-10-CM | POA: Diagnosis not present

## 2022-11-07 DIAGNOSIS — Z0000 Encounter for general adult medical examination without abnormal findings: Secondary | ICD-10-CM | POA: Diagnosis not present

## 2022-11-07 DIAGNOSIS — E7800 Pure hypercholesterolemia, unspecified: Secondary | ICD-10-CM | POA: Diagnosis not present

## 2022-11-07 DIAGNOSIS — Z1389 Encounter for screening for other disorder: Secondary | ICD-10-CM | POA: Diagnosis not present

## 2022-11-07 DIAGNOSIS — G479 Sleep disorder, unspecified: Secondary | ICD-10-CM | POA: Diagnosis not present

## 2022-11-07 DIAGNOSIS — K219 Gastro-esophageal reflux disease without esophagitis: Secondary | ICD-10-CM | POA: Diagnosis not present

## 2022-11-07 DIAGNOSIS — R9389 Abnormal findings on diagnostic imaging of other specified body structures: Secondary | ICD-10-CM | POA: Diagnosis not present

## 2022-12-10 DIAGNOSIS — I129 Hypertensive chronic kidney disease with stage 1 through stage 4 chronic kidney disease, or unspecified chronic kidney disease: Secondary | ICD-10-CM | POA: Diagnosis not present

## 2022-12-10 DIAGNOSIS — F3181 Bipolar II disorder: Secondary | ICD-10-CM | POA: Diagnosis not present

## 2022-12-10 DIAGNOSIS — E7800 Pure hypercholesterolemia, unspecified: Secondary | ICD-10-CM | POA: Diagnosis not present

## 2022-12-10 DIAGNOSIS — N1831 Chronic kidney disease, stage 3a: Secondary | ICD-10-CM | POA: Diagnosis not present

## 2022-12-10 DIAGNOSIS — F331 Major depressive disorder, recurrent, moderate: Secondary | ICD-10-CM | POA: Diagnosis not present

## 2022-12-10 DIAGNOSIS — E039 Hypothyroidism, unspecified: Secondary | ICD-10-CM | POA: Diagnosis not present

## 2022-12-10 DIAGNOSIS — K219 Gastro-esophageal reflux disease without esophagitis: Secondary | ICD-10-CM | POA: Diagnosis not present

## 2023-02-18 DIAGNOSIS — M25551 Pain in right hip: Secondary | ICD-10-CM | POA: Diagnosis not present

## 2023-02-18 DIAGNOSIS — M25561 Pain in right knee: Secondary | ICD-10-CM | POA: Diagnosis not present

## 2023-04-10 ENCOUNTER — Other Ambulatory Visit: Payer: Self-pay | Admitting: Hematology and Oncology

## 2023-04-22 ENCOUNTER — Other Ambulatory Visit: Payer: Self-pay | Admitting: Hematology and Oncology

## 2023-04-22 ENCOUNTER — Inpatient Hospital Stay (HOSPITAL_BASED_OUTPATIENT_CLINIC_OR_DEPARTMENT_OTHER): Payer: Medicare Other | Admitting: Hematology and Oncology

## 2023-04-22 ENCOUNTER — Other Ambulatory Visit: Payer: Self-pay

## 2023-04-22 ENCOUNTER — Inpatient Hospital Stay: Payer: Medicare Other | Attending: Hematology and Oncology

## 2023-04-22 VITALS — BP 154/63 | HR 57 | Temp 98.0°F | Resp 15 | Wt 149.5 lb

## 2023-04-22 DIAGNOSIS — I2602 Saddle embolus of pulmonary artery with acute cor pulmonale: Secondary | ICD-10-CM | POA: Diagnosis not present

## 2023-04-22 DIAGNOSIS — Z86711 Personal history of pulmonary embolism: Secondary | ICD-10-CM | POA: Insufficient documentation

## 2023-04-22 DIAGNOSIS — R634 Abnormal weight loss: Secondary | ICD-10-CM | POA: Insufficient documentation

## 2023-04-22 DIAGNOSIS — Z7901 Long term (current) use of anticoagulants: Secondary | ICD-10-CM | POA: Diagnosis not present

## 2023-04-22 DIAGNOSIS — K769 Liver disease, unspecified: Secondary | ICD-10-CM | POA: Diagnosis not present

## 2023-04-22 LAB — CBC WITH DIFFERENTIAL (CANCER CENTER ONLY)
Abs Immature Granulocytes: 0.03 10*3/uL (ref 0.00–0.07)
Basophils Absolute: 0.1 10*3/uL (ref 0.0–0.1)
Basophils Relative: 1 %
Eosinophils Absolute: 0.1 10*3/uL (ref 0.0–0.5)
Eosinophils Relative: 1 %
HCT: 38.2 % (ref 36.0–46.0)
Hemoglobin: 13.3 g/dL (ref 12.0–15.0)
Immature Granulocytes: 0 %
Lymphocytes Relative: 21 %
Lymphs Abs: 1.5 10*3/uL (ref 0.7–4.0)
MCH: 34.1 pg — ABNORMAL HIGH (ref 26.0–34.0)
MCHC: 34.8 g/dL (ref 30.0–36.0)
MCV: 97.9 fL (ref 80.0–100.0)
Monocytes Absolute: 0.4 10*3/uL (ref 0.1–1.0)
Monocytes Relative: 6 %
Neutro Abs: 4.9 10*3/uL (ref 1.7–7.7)
Neutrophils Relative %: 71 %
Platelet Count: 214 10*3/uL (ref 150–400)
RBC: 3.9 MIL/uL (ref 3.87–5.11)
RDW: 12.4 % (ref 11.5–15.5)
WBC Count: 7 10*3/uL (ref 4.0–10.5)
nRBC: 0 % (ref 0.0–0.2)

## 2023-04-22 LAB — CMP (CANCER CENTER ONLY)
ALT: 20 U/L (ref 0–44)
AST: 17 U/L (ref 15–41)
Albumin: 4 g/dL (ref 3.5–5.0)
Alkaline Phosphatase: 63 U/L (ref 38–126)
Anion gap: 5 (ref 5–15)
BUN: 12 mg/dL (ref 8–23)
CO2: 31 mmol/L (ref 22–32)
Calcium: 9.6 mg/dL (ref 8.9–10.3)
Chloride: 104 mmol/L (ref 98–111)
Creatinine: 1.12 mg/dL — ABNORMAL HIGH (ref 0.44–1.00)
GFR, Estimated: 52 mL/min — ABNORMAL LOW (ref >60)
Glucose, Bld: 108 mg/dL — ABNORMAL HIGH (ref 70–99)
Potassium: 4.5 mmol/L (ref 3.5–5.1)
Sodium: 140 mmol/L (ref 135–145)
Total Bilirubin: 1.1 mg/dL (ref 0.3–1.2)
Total Protein: 6.3 g/dL — ABNORMAL LOW (ref 6.5–8.1)

## 2023-04-22 NOTE — Progress Notes (Signed)
[facility] [phone]   [phone]  PROGRESS NOTE  Patient Care Team: [name], MD as PCP - General (Family Medicine)  Hematological/Oncological History # Saddle Pulmonary Embolism 03/10/2021: presented to the ED with left sided rib pain. CTA pulmonary study showed large pulmonary embolus including central and peripheral saddle emboli with heart strain. Started on Eliquis 5 mg BID 05/12/2021: establish care with Dr. Leonides Schanz  09/21/2021: transition to maintenance dose eliquis 2.5 mg BID   Interval History:  Denise Weiss 72 y.o. female with medical history significant for saddle pulmonary embolis who presents for a follow up visit. The patient's last visit was on 10/22/2022. In the interim since the last visit she has continued on PO eliquis therapy 2.5 mg BID.  On exam today Denise Weiss reports she has been well overall in the interim since her last visit.  She has gained 18 pounds since her last visit in December.  She reports that she thinks on that weight gain may be due to the fact that she is having increased right hip pain and is not moving as much.  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  She notes that she thinks it affects the blood flow in her feet as well.  She reports she is tolerating Eliquis therapy well with no bleeding, bruising, or dark stools.  She does have some bruising under her great toe nail.  She reports that she is not having any other overt signs of bleeding.  She reports overall the Eliquis is well covered by her insurance and she does not have any signs or symptoms concerning for recurrent VTE such as leg pain, persistent leg swelling, chest pain, or shortness of breath.  She otherwise denies any fevers, chills, sweats, nausea, vomiting or diarrhea.  She denies any bleeding, bruising, or dark stools.  Full 10 point ROS is listed below.  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)    Scleroderma (HCC)    Sjogren's syndrome (HCC)    Stage 3a chronic kidney disease (HCC)    Tremor     SURGICAL HISTORY: Past Surgical History:  Procedure Laterality Date   CESAREAN SECTION     KNEE SURGERY     TONSILLECTOMY      SOCIAL HISTORY: Social History   Socioeconomic History   Marital status: Single    Spouse name: Not on file   Number of children: Not on file   Years of education: Not on file   Highest education level: Not on file  Occupational History   Occupation: retired  Tobacco Use   Smoking status: Never   Smokeless tobacco: Never  Vaping Use   Vaping Use: Never used  Substance and Sexual Activity   Alcohol use: Not Currently   Drug use: Never   Sexual activity: Not on file  Other Topics Concern   Not on file  Social History Narrative   Not on file   Social Determinants of Health   Financial Resource Strain: Not on file  Food Insecurity: Not on file  Transportation Needs: Not on file  Physical Activity: Not on file  Stress: Not on file  Social Connections: Not on file  Intimate  Partner Violence: Not on file    FAMILY HISTORY: Family History  Problem Relation Age of Onset   Hepatitis C Mother    Heart attack Father    Schizophrenia Sister     ALLERGIES:  has No Known Allergies.  MEDICATIONS:  Current Outpatient Medications  Medication Sig Dispense Refill   acetaminophen (TYLENOL) 650 MG CR tablet Take 650 mg by mouth every 8 (eight) hours as needed for pain.     ALPRAZolam (XANAX) 0.25 MG tablet Take 0.25-0.5 mg by mouth daily as needed for sleep.     atenolol (TENORMIN) 50 MG tablet Take 50 mg by mouth 2 (two) times daily.     atorvastatin (LIPITOR) 20 MG tablet Take 20 mg by mouth daily.     ELIQUIS 2.5 MG TABS tablet  TAKE 1 TABLET(2.5 MG) BY MOUTH TWICE DAILY 180 tablet 2   levothyroxine (SYNTHROID) 100 MCG tablet Take 100 mcg by mouth every morning.     losartan (COZAAR) 25 MG tablet Take 50 mg by mouth daily.     Multiple Vitamin (MULTIVITAMIN WITH MINERALS) TABS tablet Take 1 tablet by mouth daily.     omeprazole (PRILOSEC) 20 MG capsule Take 20 mg by mouth daily.     traZODone (DESYREL) 50 MG tablet Take 50 mg by mouth at bedtime. Take 1-3 tablets at bedtime as needed     triamcinolone cream (KENALOG) 0.1 % Apply 1 application topically 2 (two) times daily.     TRINTELLIX 5 MG TABS tablet Take 5 mg by mouth daily.     No current facility-administered medications for this visit.    REVIEW OF SYSTEMS:   Constitutional: ( - ) fevers, ( - )  chills , ( - ) night sweats Eyes: ( - ) blurriness of vision, ( - ) double vision, ( - ) watery eyes Ears, nose, mouth, throat, and face: ( - ) mucositis, ( - ) sore throat Respiratory: ( - ) cough, ( - ) dyspnea, ( - ) wheezes Cardiovascular: ( - ) palpitation, ( - ) chest discomfort, ( - ) lower extremity swelling Gastrointestinal:  ( - ) nausea, ( - ) heartburn, ( - ) change in bowel habits Skin: ( - ) abnormal skin rashes Lymphatics: ( - ) new lymphadenopathy, ( - ) easy bruising Neurological: ( - ) numbness, ( - ) tingling, ( - ) new weaknesses Behavioral/Psych: ( - ) mood change, ( - ) new changes  All other systems were reviewed with the patient and are negative.  PHYSICAL EXAMINATION: ECOG PERFORMANCE STATUS: 1 - Symptomatic but completely ambulatory  Vitals:   04/22/23 1435  BP: (!) 154/63  Pulse: (!) 57  Resp: 15  Temp: 98 F (36.7 C)  SpO2: 96%     Filed Weights   04/22/23 1435  Weight: 149 lb 8 oz (67.8 kg)      GENERAL: Well-appearing elderly Caucasian female, alert, no distress and comfortable SKIN: skin color, texture, turgor are normal, no rashes or significant lesions EYES: conjunctiva are pink and non-injected, sclera  clear LUNGS: clear to auscultation and percussion with normal breathing effort HEART: regular rate & rhythm and no murmurs and no lower extremity edema Musculoskeletal: no cyanosis of digits and no clubbing  PSYCH: alert & oriented x 3, fluent speech NEURO: no focal motor/sensory deficits  LABORATORY DATA:  I have reviewed the data as listed    Latest Ref Rng & Units 04/22/2023    2:07 PM 10/22/2022   12:30 PM  03/23/2022    2:17 PM  CBC  WBC 4.0 - 10.5 K/uL 7.0  5.5  6.4   Hemoglobin 12.0 - 15.0 g/dL 40.9  81.1  91.4   Hematocrit 36.0 - 46.0 % 38.2  39.7  39.8   Platelets 150 - 400 K/uL 214  213  246        Latest Ref Rng & Units 04/22/2023    2:07 PM 10/22/2022   12:30 PM 03/23/2022    2:17 PM  CMP  Glucose 70 - 99 mg/dL 782  956  213   BUN 8 - 23 mg/dL 12  16  11    Creatinine 0.44 - 1.00 mg/dL 0.86  5.78  4.69   Sodium 135 - 145 mmol/L 140  137  140   Potassium 3.5 - 5.1 mmol/L 4.5  4.7  4.5   Chloride 98 - 111 mmol/L 104  104  102   CO2 22 - 32 mmol/L 31  29  29    Calcium 8.9 - 10.3 mg/dL 9.6  9.7  9.5   Total Protein 6.5 - 8.1 g/dL 6.3  6.3  6.8   Total Bilirubin 0.3 - 1.2 mg/dL 1.1  0.8  1.3   Alkaline Phos 38 - 126 U/L 63  66  59   AST 15 - 41 U/L 17  17  21    ALT 0 - 44 U/L 20  17  25      RADIOGRAPHIC STUDIES: No results found.  ASSESSMENT & PLAN Denise Weiss 72 y.o. female with medical history significant for saddle pulmonary embolis who presents for a follow up visit.  After review of the labs, review of the records, and discussion with the patient the patients findings are most consistent with a massive unprovoked saddle pulmonary embolism.  Given the unprovoked nature of this VTE and her prior history of VTE I would recommend that she continue on lifelong anticoagulation.  The recommendation would be for full-strength Eliquis 5 mg twice daily for at least 6 months time.  After that she could consider transition to maintenance Eliquis 2.5 mg twice daily.  We  had a discussion on 09/21/2021 and she was agreeable to transitioning down to the lower dose of maintenance therapy.  As long as the patient is tolerating the medication well without too much financial difficulty we can continue this therapy.  If there were to be any issues with the medicine we recommend she reach out to our clinic prior to her follow-up visit in 52-months.  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  Due to this I would recommend indefinite lifelong anticoagulation. --Continue Eliquis 2.5 mg twice daily for thromboprophylaxis. --Patient has completed 6 full months of full-strength anticoagulation therapy, she was agreeable to transition to maintenance Eliquis 2.5 mg twice daily --If the patient develops bleeding, intolerance, or the medication is financially difficult to continue would recommend the patient reach out to our clinic for alternative options. --labs today show white blood cell 7.0, hemoglobin 13.3, MCV 97.9, and platelets of 214 --We will have the patient return to clinic in 6 months time to assure tolerance of anticoagulation therapy.   # Liver Lesion # Weight Loss--improved  # Possible Metastatic Disease -- Noted on prior CT scan --I encourage patient to pursue an MRI liver in order to further assess.  She noted she does not wish to have this done as she does  not want to subsequent workup to follow afterwards.  She notes that she is a DNR/DNI and does not wish to pursue this workup because she would not want to do anything with the results.  No orders of the defined types were placed in this encounter.  All questions were answered. The patient knows to call the clinic with any problems, questions or concerns.  A total of more than 30 minutes were spent on this encounter with face-to-face time and non-face-to-face time, including preparing to see the patient,  ordering tests and/or medications, counseling the patient and coordination of care as outlined above.   Ulysees Barns, MD Department of Hematology/Oncology [facility] at [facility] Phone: [phone] Pager: [phone] Email: [email or web address]  04/22/2023 4:15 PM

## 2023-05-06 DIAGNOSIS — E039 Hypothyroidism, unspecified: Secondary | ICD-10-CM | POA: Diagnosis not present

## 2023-05-06 DIAGNOSIS — Z6826 Body mass index (BMI) 26.0-26.9, adult: Secondary | ICD-10-CM | POA: Diagnosis not present

## 2023-05-06 DIAGNOSIS — F3181 Bipolar II disorder: Secondary | ICD-10-CM | POA: Diagnosis not present

## 2023-05-06 DIAGNOSIS — N1831 Chronic kidney disease, stage 3a: Secondary | ICD-10-CM | POA: Diagnosis not present

## 2023-05-06 DIAGNOSIS — L989 Disorder of the skin and subcutaneous tissue, unspecified: Secondary | ICD-10-CM | POA: Diagnosis not present

## 2023-05-06 DIAGNOSIS — K219 Gastro-esophageal reflux disease without esophagitis: Secondary | ICD-10-CM | POA: Diagnosis not present

## 2023-05-06 DIAGNOSIS — E7800 Pure hypercholesterolemia, unspecified: Secondary | ICD-10-CM | POA: Diagnosis not present

## 2023-05-06 DIAGNOSIS — Z7901 Long term (current) use of anticoagulants: Secondary | ICD-10-CM | POA: Diagnosis not present

## 2023-05-06 DIAGNOSIS — M349 Systemic sclerosis, unspecified: Secondary | ICD-10-CM | POA: Diagnosis not present

## 2023-05-06 DIAGNOSIS — I700 Atherosclerosis of aorta: Secondary | ICD-10-CM | POA: Diagnosis not present

## 2023-05-06 DIAGNOSIS — R2689 Other abnormalities of gait and mobility: Secondary | ICD-10-CM | POA: Diagnosis not present

## 2023-05-06 DIAGNOSIS — F331 Major depressive disorder, recurrent, moderate: Secondary | ICD-10-CM | POA: Diagnosis not present

## 2023-05-07 DIAGNOSIS — H61009 Unspecified perichondritis of external ear, unspecified ear: Secondary | ICD-10-CM | POA: Diagnosis not present

## 2023-05-07 DIAGNOSIS — D492 Neoplasm of unspecified behavior of bone, soft tissue, and skin: Secondary | ICD-10-CM | POA: Diagnosis not present

## 2023-10-21 ENCOUNTER — Inpatient Hospital Stay (HOSPITAL_BASED_OUTPATIENT_CLINIC_OR_DEPARTMENT_OTHER): Payer: Medicare Other | Admitting: Hematology and Oncology

## 2023-10-21 ENCOUNTER — Inpatient Hospital Stay: Payer: Medicare Other | Attending: Hematology and Oncology

## 2023-10-21 ENCOUNTER — Other Ambulatory Visit: Payer: Self-pay | Admitting: Hematology and Oncology

## 2023-10-21 VITALS — BP 152/64 | HR 63 | Temp 97.7°F | Resp 13 | Wt 159.9 lb

## 2023-10-21 DIAGNOSIS — D500 Iron deficiency anemia secondary to blood loss (chronic): Secondary | ICD-10-CM

## 2023-10-21 DIAGNOSIS — I2602 Saddle embolus of pulmonary artery with acute cor pulmonale: Secondary | ICD-10-CM

## 2023-10-21 DIAGNOSIS — Z7901 Long term (current) use of anticoagulants: Secondary | ICD-10-CM | POA: Diagnosis not present

## 2023-10-21 DIAGNOSIS — Z86711 Personal history of pulmonary embolism: Secondary | ICD-10-CM | POA: Diagnosis not present

## 2023-10-21 DIAGNOSIS — K769 Liver disease, unspecified: Secondary | ICD-10-CM | POA: Diagnosis not present

## 2023-10-21 DIAGNOSIS — R634 Abnormal weight loss: Secondary | ICD-10-CM | POA: Insufficient documentation

## 2023-10-21 LAB — CBC WITH DIFFERENTIAL (CANCER CENTER ONLY)
Abs Immature Granulocytes: 0.02 10*3/uL (ref 0.00–0.07)
Basophils Absolute: 0 10*3/uL (ref 0.0–0.1)
Basophils Relative: 1 %
Eosinophils Absolute: 0.1 10*3/uL (ref 0.0–0.5)
Eosinophils Relative: 2 %
HCT: 41.3 % (ref 36.0–46.0)
Hemoglobin: 13.8 g/dL (ref 12.0–15.0)
Immature Granulocytes: 0 %
Lymphocytes Relative: 21 %
Lymphs Abs: 1.3 10*3/uL (ref 0.7–4.0)
MCH: 33.3 pg (ref 26.0–34.0)
MCHC: 33.4 g/dL (ref 30.0–36.0)
MCV: 99.5 fL (ref 80.0–100.0)
Monocytes Absolute: 0.4 10*3/uL (ref 0.1–1.0)
Monocytes Relative: 6 %
Neutro Abs: 4.4 10*3/uL (ref 1.7–7.7)
Neutrophils Relative %: 70 %
Platelet Count: 220 10*3/uL (ref 150–400)
RBC: 4.15 MIL/uL (ref 3.87–5.11)
RDW: 12.5 % (ref 11.5–15.5)
WBC Count: 6.2 10*3/uL (ref 4.0–10.5)
nRBC: 0 % (ref 0.0–0.2)

## 2023-10-21 LAB — CMP (CANCER CENTER ONLY)
ALT: 19 U/L (ref 0–44)
AST: 18 U/L (ref 15–41)
Albumin: 4.4 g/dL (ref 3.5–5.0)
Alkaline Phosphatase: 67 U/L (ref 38–126)
Anion gap: 8 (ref 5–15)
BUN: 15 mg/dL (ref 8–23)
CO2: 28 mmol/L (ref 22–32)
Calcium: 9.9 mg/dL (ref 8.9–10.3)
Chloride: 103 mmol/L (ref 98–111)
Creatinine: 1.19 mg/dL — ABNORMAL HIGH (ref 0.44–1.00)
GFR, Estimated: 48 mL/min — ABNORMAL LOW (ref >60)
Glucose, Bld: 108 mg/dL — ABNORMAL HIGH (ref 70–99)
Potassium: 4.5 mmol/L (ref 3.5–5.1)
Sodium: 139 mmol/L (ref 135–145)
Total Bilirubin: 1 mg/dL (ref <1.2)
Total Protein: 6.8 g/dL (ref 6.5–8.1)

## 2023-10-21 NOTE — Progress Notes (Signed)
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 CTA pulmonary study showed large pulmonary embolus including central and peripheral saddle emboli with heart strain. Started on Eliquis 5 mg BID 05/12/2021: establish care with Dr. Leonides Schanz  09/21/2021: transition to maintenance dose eliquis 2.5 mg BID   Interval History:  REALYNN Weiss 72 y.o. female with medical history significant for saddle pulmonary embolis who presents for a follow up visit. The patient's last visit was on 04/22/2023. In the interim since the last visit she has continued on PO eliquis therapy 2.5 mg BID.  On exam today Mrs. Betteridge reports she will be visiting her daughter in [place] for Christmas.  She reports that she feels like her scleroderma is progressing across her shoulders, chest and the right side of her face.  She reports that there are several "dead spots" of nerves on her skin.  She reports that her toes have been numb.  She notes that her mobility has decreased due to this.  She notes that she is not currently on any medication from this as she declined steroid and other offer treatments.  She reports that she continues taking her Eliquis 2.5 mg twice daily.  She is not having any symptoms of clot though she does have some occasional shortness of breath.  She is not having any chest pain, leg swelling, or pain in the legs.  She does not have any overt signs of bleeding, bruising, or dark stools, though she does have some occasional gum bleeding when brushing her teeth.  She reports that her weight has been increasing and she is up to 159 pounds from 131 pounds.  She is unsure how this is happening.  She otherwise denies any fevers, chills, sweats, nausea, vomiting or diarrhea. Full 10  point ROS is listed below.  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)    Scleroderma (HCC)    Sjogren's syndrome (HCC)    Stage 3a chronic kidney disease (HCC)    Tremor     SURGICAL HISTORY: Past Surgical History:  Procedure Laterality Date   CESAREAN SECTION     KNEE SURGERY     TONSILLECTOMY      SOCIAL HISTORY: Social History   Socioeconomic History   Marital status: Single    Spouse name: Not on file   Number of children: Not on file   Years of education: Not on file   Highest education level: Not on file  Occupational History   Occupation: retired  Tobacco Use   Smoking status: Never   Smokeless tobacco: Never  Vaping Use   Vaping status: Never Used  Substance and Sexual Activity   Alcohol use: Not Currently   Drug use: Never   Sexual activity: Not on file  Other Topics Concern   Not on file  Social History Narrative   Not on file   Social Drivers of Health   Financial Resource Strain: Not on file  Food Insecurity: Not on file  Transportation Needs: Not on file  Physical Activity: Not on file  Stress:  Not on file  Social Connections: Not on file  Intimate Partner Violence: Not on file    FAMILY HISTORY: Family History  Problem Relation Age of Onset   Hepatitis C Mother    Heart attack Father    Schizophrenia Sister     ALLERGIES:  has no known allergies.  MEDICATIONS:  Current Outpatient Medications  Medication Sig Dispense Refill   acetaminophen (TYLENOL) 650 MG CR tablet Take 650 mg by mouth every 8 (eight) hours as needed for pain.     ALPRAZolam (XANAX) 0.25 MG tablet Take 0.25-0.5 mg by mouth daily as needed for sleep.     atenolol (TENORMIN) 50 MG tablet Take 50 mg by mouth 2 (two) times daily.     atorvastatin (LIPITOR) 20 MG tablet  Take 20 mg by mouth daily.     ELIQUIS 2.5 MG TABS tablet TAKE 1 TABLET(2.5 MG) BY MOUTH TWICE DAILY 180 tablet 2   levothyroxine (SYNTHROID) 100 MCG tablet Take 100 mcg by mouth every morning.     losartan (COZAAR) 25 MG tablet Take 50 mg by mouth daily.     Multiple Vitamin (MULTIVITAMIN WITH MINERALS) TABS tablet Take 1 tablet by mouth daily.     omeprazole (PRILOSEC) 20 MG capsule Take 20 mg by mouth daily.     traZODone (DESYREL) 50 MG tablet Take 50 mg by mouth at bedtime. Take 1-3 tablets at bedtime as needed     triamcinolone cream (KENALOG) 0.1 % Apply 1 application topically 2 (two) times daily.     TRINTELLIX 5 MG TABS tablet Take 5 mg by mouth daily.     No current facility-administered medications for this visit.    REVIEW OF SYSTEMS:   Constitutional: ( - ) fevers, ( - )  chills , ( - ) night sweats Eyes: ( - ) blurriness of vision, ( - ) double vision, ( - ) watery eyes Ears, nose, mouth, throat, and face: ( - ) mucositis, ( - ) sore throat Respiratory: ( - ) cough, ( - ) dyspnea, ( - ) wheezes Cardiovascular: ( - ) palpitation, ( - ) chest discomfort, ( - ) lower extremity swelling Gastrointestinal:  ( - ) nausea, ( - ) heartburn, ( - ) change in bowel habits Skin: ( - ) abnormal skin rashes Lymphatics: ( - ) new lymphadenopathy, ( - ) easy bruising Neurological: ( - ) numbness, ( - ) tingling, ( - ) new weaknesses Behavioral/Psych: ( - ) mood change, ( - ) new changes  All other systems were reviewed with the patient and are negative.  PHYSICAL EXAMINATION: ECOG PERFORMANCE STATUS: 1 - Symptomatic but completely ambulatory  Vitals:   10/21/23 1449  BP: (!) 152/64  Pulse: 63  Resp: 13  Temp: 97.7 F (36.5 C)  SpO2: 96%      Filed Weights   10/21/23 1449  Weight: 159 lb 14.4 oz (72.5 kg)       GENERAL: Well-appearing elderly Caucasian female, alert, no distress and comfortable SKIN: skin color, texture, turgor are normal, no rashes or significant  lesions EYES: conjunctiva are pink and non-injected, sclera clear LUNGS: clear to auscultation and percussion with normal breathing effort HEART: regular rate & rhythm and no murmurs and no lower extremity edema Musculoskeletal: no cyanosis of digits and no clubbing  PSYCH: alert & oriented x 3, fluent speech NEURO: no focal motor/sensory deficits  LABORATORY DATA:  I have reviewed the data as listed    Latest Ref Rng &  Units 10/21/2023    2:24 PM 04/22/2023    2:07 PM 10/22/2022   12:30 PM  CBC  WBC 4.0 - 10.5 K/uL 6.2  7.0  5.5   Hemoglobin 12.0 - 15.0 g/dL 16.1  09.6  04.5   Hematocrit 36.0 - 46.0 % 41.3  38.2  39.7   Platelets 150 - 400 K/uL 220  214  213        Latest Ref Rng & Units 10/21/2023    2:24 PM 04/22/2023    2:07 PM 10/22/2022   12:30 PM  CMP  Glucose 70 - 99 mg/dL 409  811  914   BUN 8 - 23 mg/dL 15  12  16    Creatinine 0.44 - 1.00 mg/dL 7.82  9.56  2.13   Sodium 135 - 145 mmol/L 139  140  137   Potassium 3.5 - 5.1 mmol/L 4.5  4.5  4.7   Chloride 98 - 111 mmol/L 103  104  104   CO2 22 - 32 mmol/L 28  31  29    Calcium 8.9 - 10.3 mg/dL 9.9  9.6  9.7   Total Protein 6.5 - 8.1 g/dL 6.8  6.3  6.3   Total Bilirubin <1.2 mg/dL 1.0  1.1  0.8   Alkaline Phos 38 - 126 U/L 67  63  66   AST 15 - 41 U/L 18  17  17    ALT 0 - 44 U/L 19  20  17      RADIOGRAPHIC STUDIES: No results found.  ASSESSMENT & PLAN YARED HERPIN 72 y.o. female with medical history significant for saddle pulmonary embolis who presents for a follow up visit.  After review of the labs, review of the records, and discussion with the patient the patients findings are most consistent with a massive unprovoked saddle pulmonary embolism.  Given the unprovoked nature of this VTE and her prior history of VTE I would recommend that she continue on lifelong anticoagulation.  The recommendation would be for full-strength Eliquis 5 mg twice daily for at least 6 months time.  After that she could consider  transition to maintenance Eliquis 2.5 mg twice daily.  We had a discussion on 09/21/2021 and she was agreeable to transitioning down to the lower dose of maintenance therapy.  As long as the patient is tolerating the medication well without too much financial difficulty we can continue this therapy.  If there were to be any issues with the medicine we recommend she reach out to our clinic prior to her follow-up visit in 96-months.  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  Due to this I would recommend indefinite lifelong anticoagulation. --Continue Eliquis 2.5 mg twice daily for thromboprophylaxis. --Patient has completed 6 full months of full-strength anticoagulation therapy, she was agreeable to transition to maintenance Eliquis 2.5 mg twice daily --If the patient develops bleeding, intolerance, or the medication is financially difficult to continue would recommend the patient reach out to our clinic for alternative options. --labs today show white blood cell 6.2, Hgb 13.8, MCV 99.5, Plt 220 --We will have the patient return to clinic in 6 months time to assure continued tolerance of anticoagulation therapy.   # Liver Lesion # Weight Loss--improved  # Possible Metastatic Disease -- Noted on prior CT scan --I encourage patient to pursue an MRI liver in order to further assess.  She noted she does  not wish to have this done as she does not want to subsequent workup to follow afterwards.  She notes that she is a DNR/DNI and does not wish to pursue this workup because she would not want to do anything with the results.  No orders of the defined types were placed in this encounter.  All questions were answered. The patient knows to call the clinic with any problems, questions or concerns.  A total of more than 30 minutes were spent on this encounter with face-to-face time and non-face-to-face  time, including preparing to see the patient, ordering tests and/or medications, counseling the patient and coordination of care as outlined above.   Ulysees Barns, MD Department of Hematology/Oncology [facility] at [facility], [place] Phone: [phone] Pager: [phone] Email: [email or web address]  10/22/2023 3:55 PM

## 2023-11-13 DIAGNOSIS — Z0000 Encounter for general adult medical examination without abnormal findings: Secondary | ICD-10-CM | POA: Diagnosis not present

## 2023-11-13 DIAGNOSIS — F320 Major depressive disorder, single episode, mild: Secondary | ICD-10-CM | POA: Diagnosis not present

## 2023-11-13 DIAGNOSIS — G479 Sleep disorder, unspecified: Secondary | ICD-10-CM | POA: Diagnosis not present

## 2023-11-13 DIAGNOSIS — E039 Hypothyroidism, unspecified: Secondary | ICD-10-CM | POA: Diagnosis not present

## 2023-11-13 DIAGNOSIS — Z86711 Personal history of pulmonary embolism: Secondary | ICD-10-CM | POA: Diagnosis not present

## 2023-11-13 DIAGNOSIS — N1831 Chronic kidney disease, stage 3a: Secondary | ICD-10-CM | POA: Diagnosis not present

## 2023-11-13 DIAGNOSIS — K769 Liver disease, unspecified: Secondary | ICD-10-CM | POA: Diagnosis not present

## 2023-11-13 DIAGNOSIS — E7800 Pure hypercholesterolemia, unspecified: Secondary | ICD-10-CM | POA: Diagnosis not present

## 2023-11-13 DIAGNOSIS — M349 Systemic sclerosis, unspecified: Secondary | ICD-10-CM | POA: Diagnosis not present

## 2023-11-13 DIAGNOSIS — I129 Hypertensive chronic kidney disease with stage 1 through stage 4 chronic kidney disease, or unspecified chronic kidney disease: Secondary | ICD-10-CM | POA: Diagnosis not present

## 2023-12-14 IMAGING — US US BREAST*R* LIMITED INC AXILLA
1 series · 5 of 5 positions shown · non-contrast
Comparison: Previous exam(s).
COMPARISON: Previous exam(s).

Addendum:
CLINICAL DATA: 70-year-old female for delayed six-month follow-up
of bilateral breast masses (now 9 months).



[Series 1: us breast*right* limited inc axilla · 0.06mm/px · 5 of 5 slices shown]
[im 1/5]
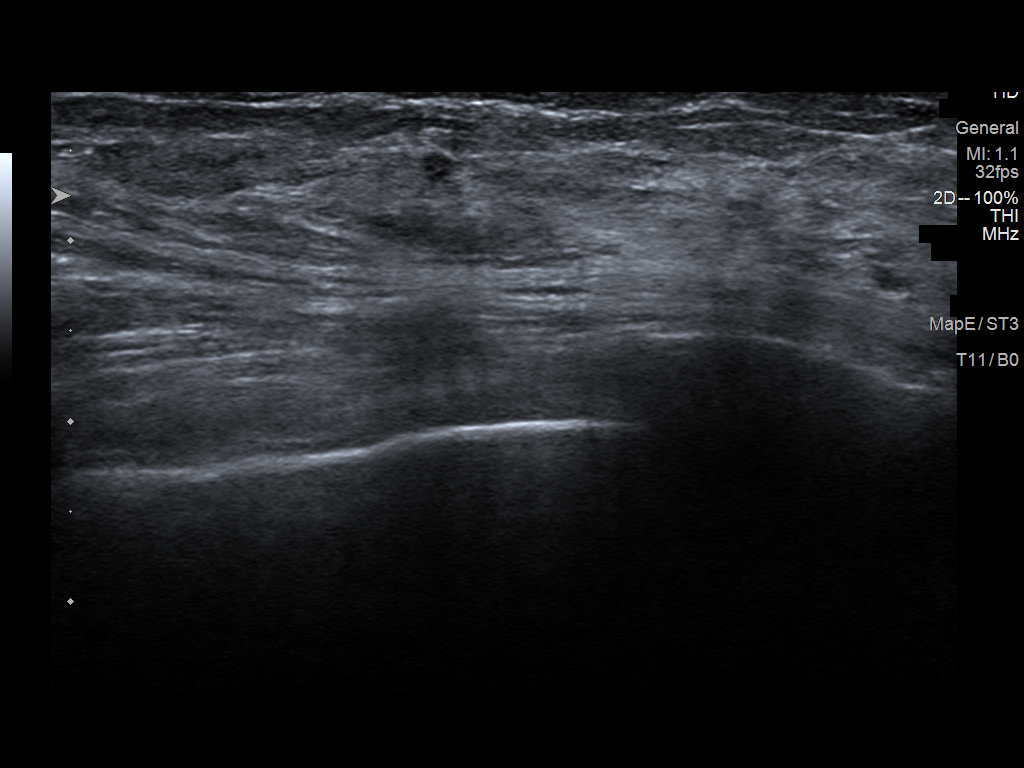
[im 2/5]
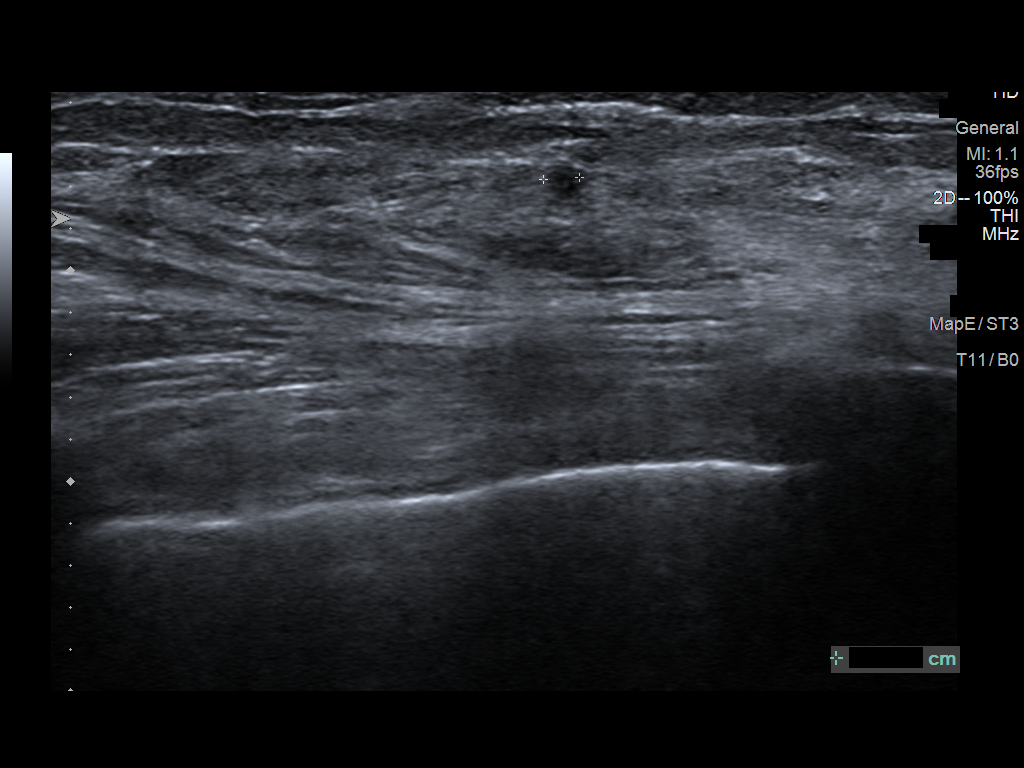
[im 3/5]
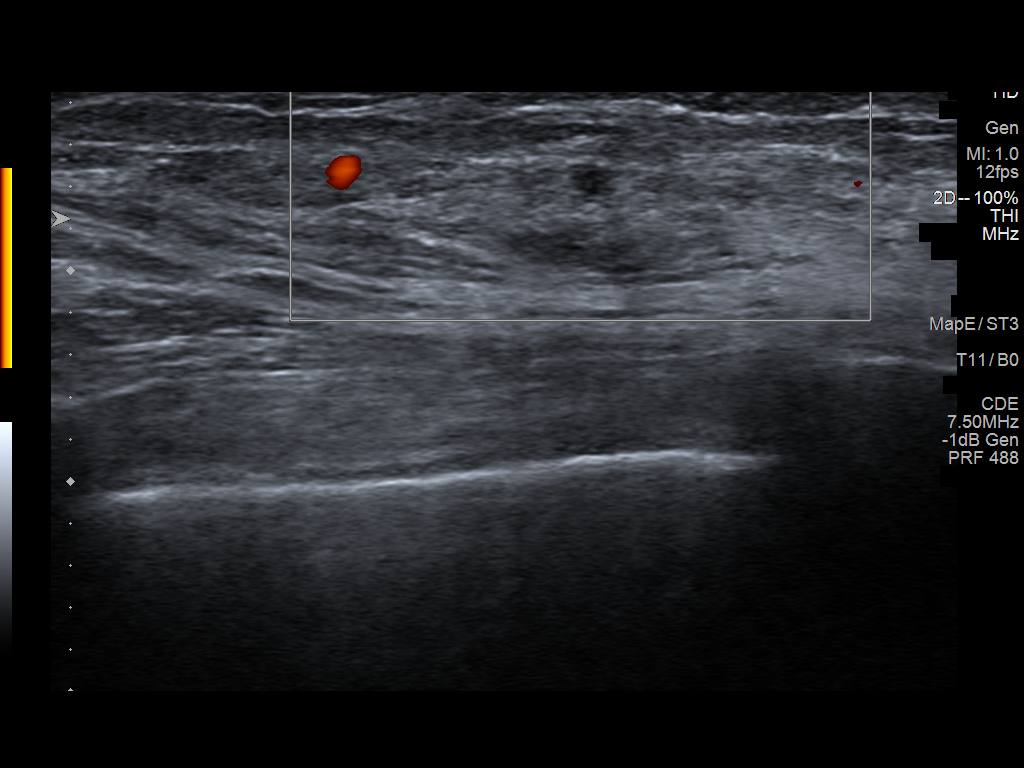
[im 4/5]
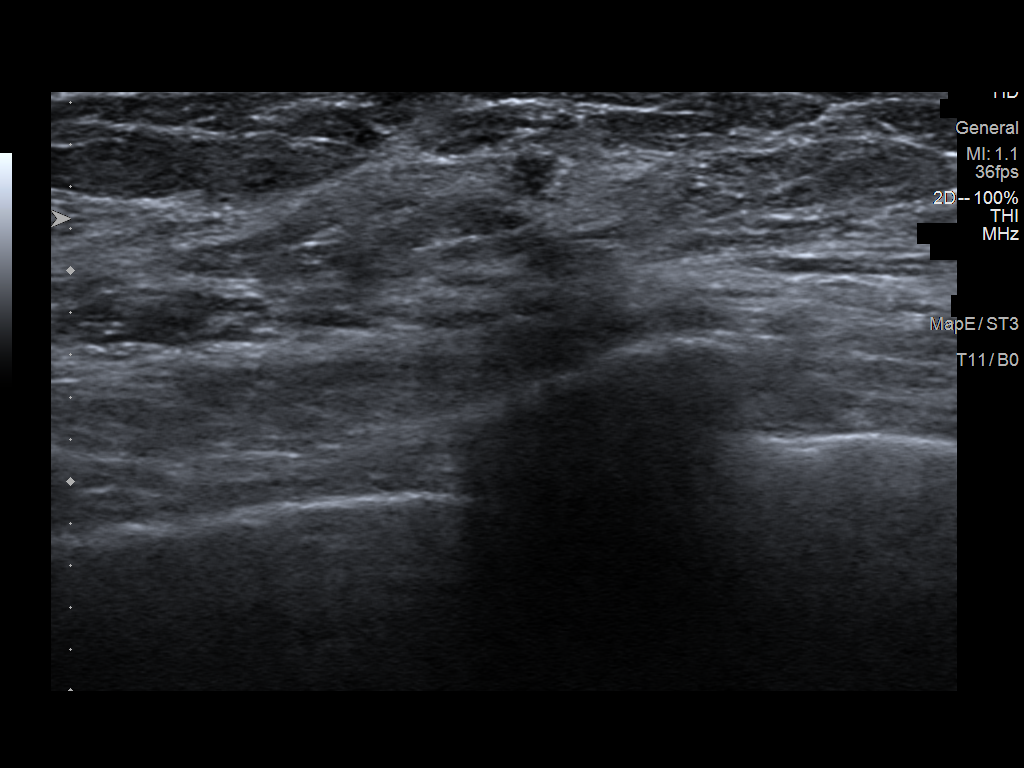
[im 5/5]
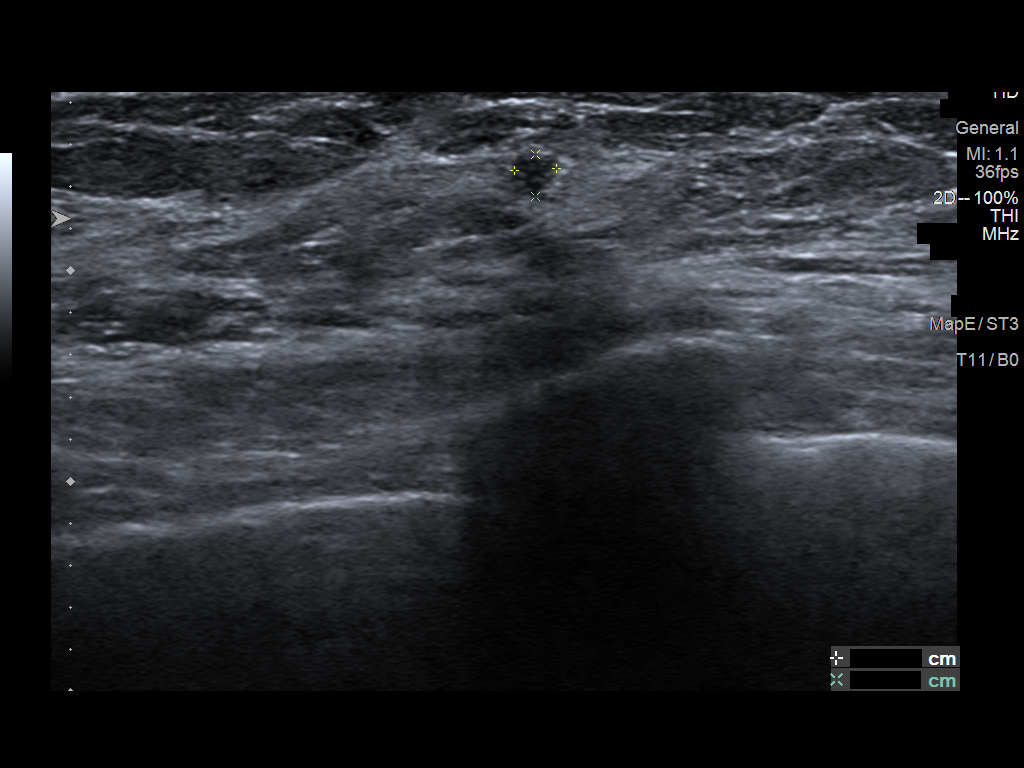

[5 of 5 positions shown; findings below may reference images not displayed]

ACR Breast Density Category b: There are scattered areas of
fibroglandular density.
FINDINGS: 2D/3D full field views of both breasts demonstrate resolution of 2
benign masses within the OUTER RIGHT breast previously demonstrated
as cysts.

Unchanged asymmetry within the OUTER RIGHT breast and unchanged
asymmetry within the anterior OUTER LEFT breast are again noted.

No new mammographic findings are noted.

Targeted ultrasound is performed, showing the following:

RIGHT breast:

A 0.2 x 0.2 x 0.2 cm hypoechoic mass at the 9 o'clock position 6 cm
from the nipple, previously measuring 0.3 x 0.3 x 0.6 cm.

LEFT breast:

A stable 0.5 x 0.2 x 0.6 cm circumscribed oval hypoechoic mass at
the 4 o'clock position 1 cm from the nipple.
IMPRESSION: 1. Stable 0.6 cm likely benign LOWER OUTER LEFT breast mass.
Follow-up in 16 months recommended to ensure 2 year stability.
2. Decreased size of OUTER RIGHT breast mass compatible with a
benign process.
3. Resolution of OUTER RIGHT breast cysts.
4. No new or suspicious mammographic findings within either breast.

RECOMMENDATION:
Bilateral diagnostic mammogram and LEFT breast ultrasound in 16
months to resume annual mammogram schedule and to reassess likely
benign LEFT breast finding.

I have discussed the findings and recommendations with the patient.
If applicable, a reminder letter will be sent to the patient
regarding the next appointment.

BI-RADS CATEGORY  3: Probably benign.

ADDENDUM:
RECOMMENDATION should read:

Bilateral diagnostic mammogram and LEFT breast ultrasound in 12
months.

*** End of Addendum ***
ACR Breast Density Category b: There are scattered areas of
fibroglandular density.
FINDINGS: 2D/3D full field views of both breasts demonstrate resolution of 2
benign masses within the OUTER RIGHT breast previously demonstrated
as cysts.

Unchanged asymmetry within the OUTER RIGHT breast and unchanged
asymmetry within the anterior OUTER LEFT breast are again noted.

No new mammographic findings are noted.

Targeted ultrasound is performed, showing the following:

RIGHT breast:

A 0.2 x 0.2 x 0.2 cm hypoechoic mass at the 9 o'clock position 6 cm
from the nipple, previously measuring 0.3 x 0.3 x 0.6 cm.

LEFT breast:

A stable 0.5 x 0.2 x 0.6 cm circumscribed oval hypoechoic mass at
the 4 o'clock position 1 cm from the nipple.
IMPRESSION: 1. Stable 0.6 cm likely benign LOWER OUTER LEFT breast mass.
Follow-up in 16 months recommended to ensure 2 year stability.
2. Decreased size of OUTER RIGHT breast mass compatible with a
benign process.
3. Resolution of OUTER RIGHT breast cysts.
4. No new or suspicious mammographic findings within either breast.

RECOMMENDATION:
Bilateral diagnostic mammogram and LEFT breast ultrasound in 16
months to resume annual mammogram schedule and to reassess likely
benign LEFT breast finding.

I have discussed the findings and recommendations with the patient.
If applicable, a reminder letter will be sent to the patient
regarding the next appointment.

BI-RADS CATEGORY  3: Probably benign.

## 2023-12-14 IMAGING — MG DIGITAL DIAGNOSTIC BILAT W/ TOMO W/ CAD
8 series · 8 of 24 positions shown · non-contrast
Comparison: Previous exam(s).
COMPARISON: Previous exam(s).

Addendum:
CLINICAL DATA: 70-year-old female for delayed six-month follow-up
of bilateral breast masses (now 9 months).



[L MLO synth-2D]
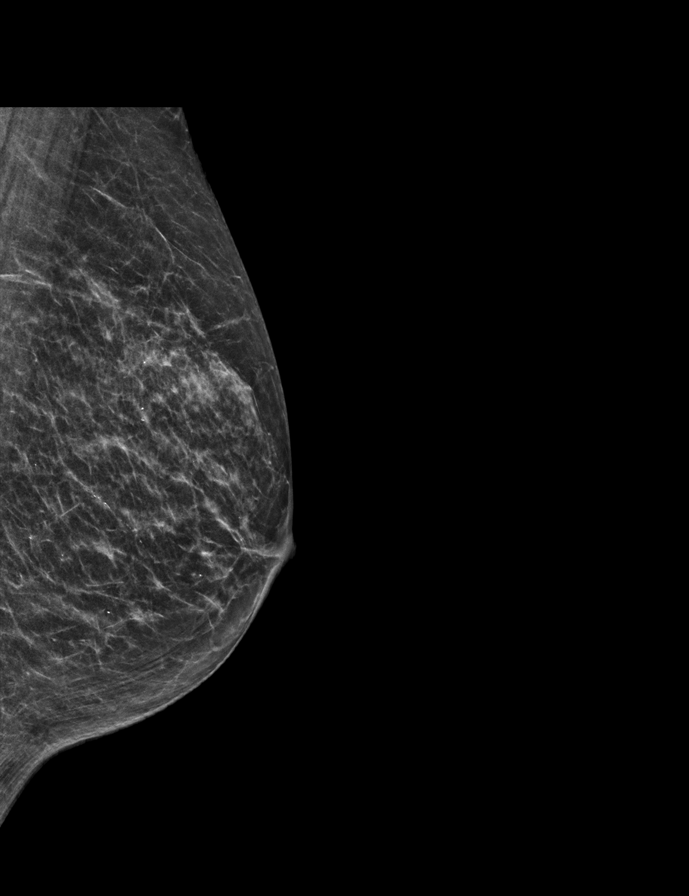

[R MLO synth-2D]
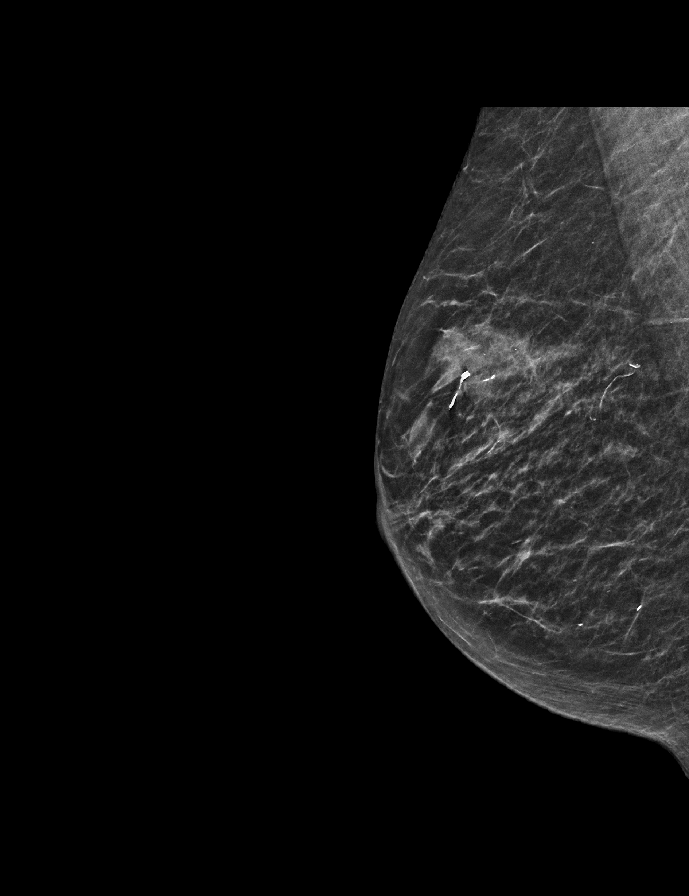

[L CC synth-2D]
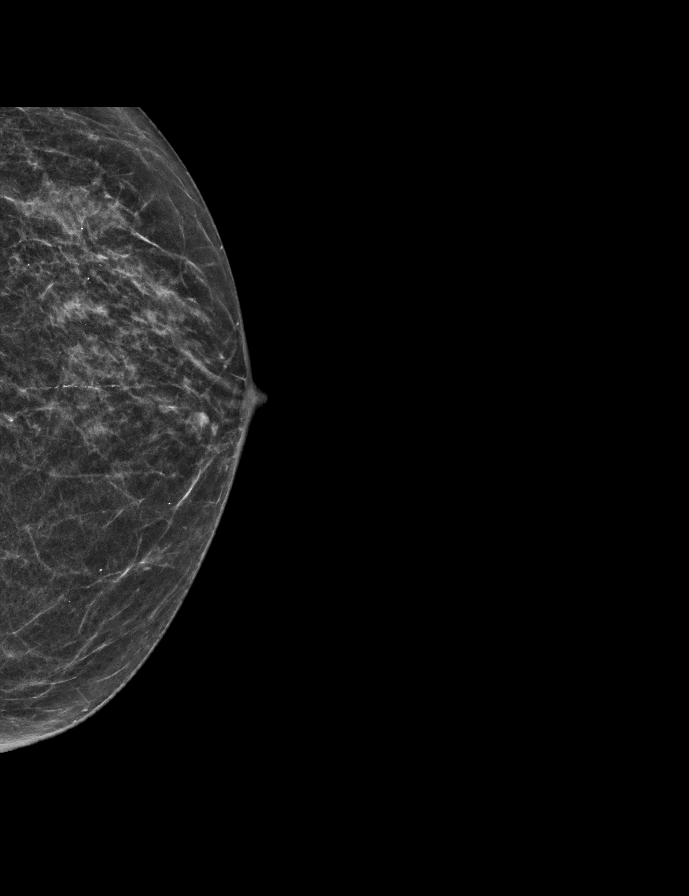

[R CC synth-2D]
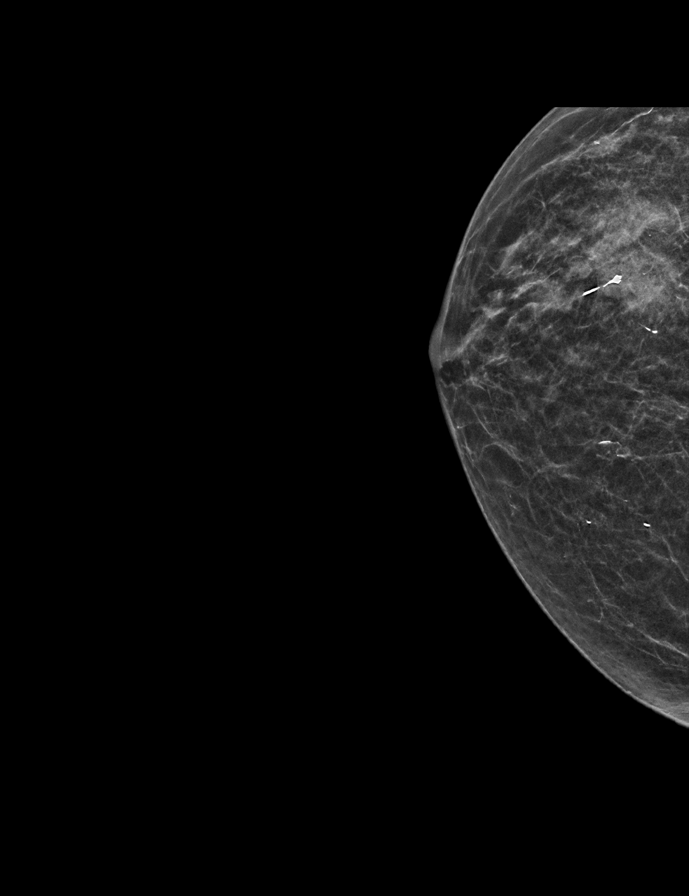

[R MLO tomo · tomo slice 23/44.0]
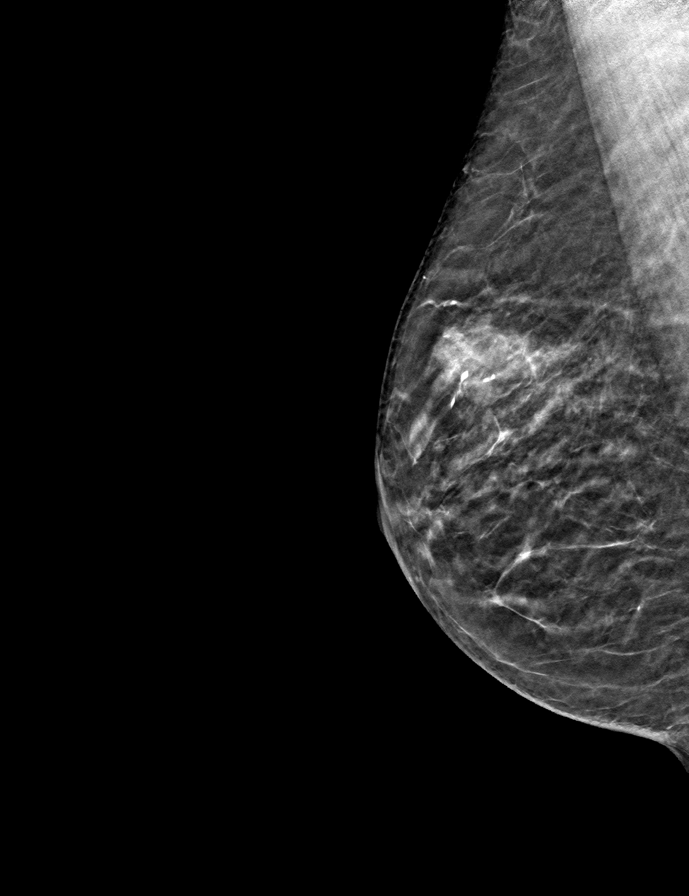

[L MLO tomo · tomo slice 24/47.0]
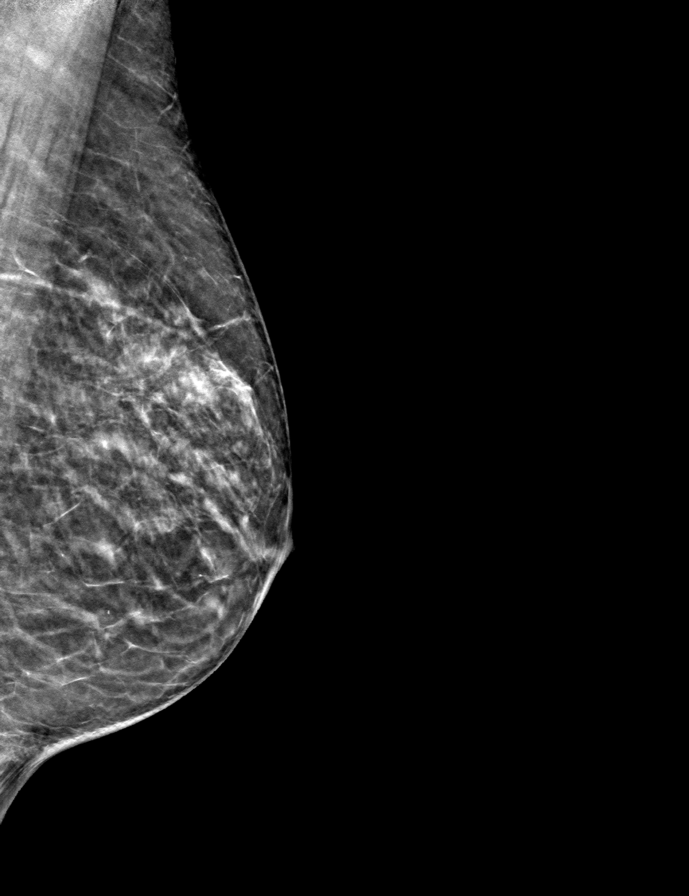

[R CC tomo · tomo slice 19/38.0]
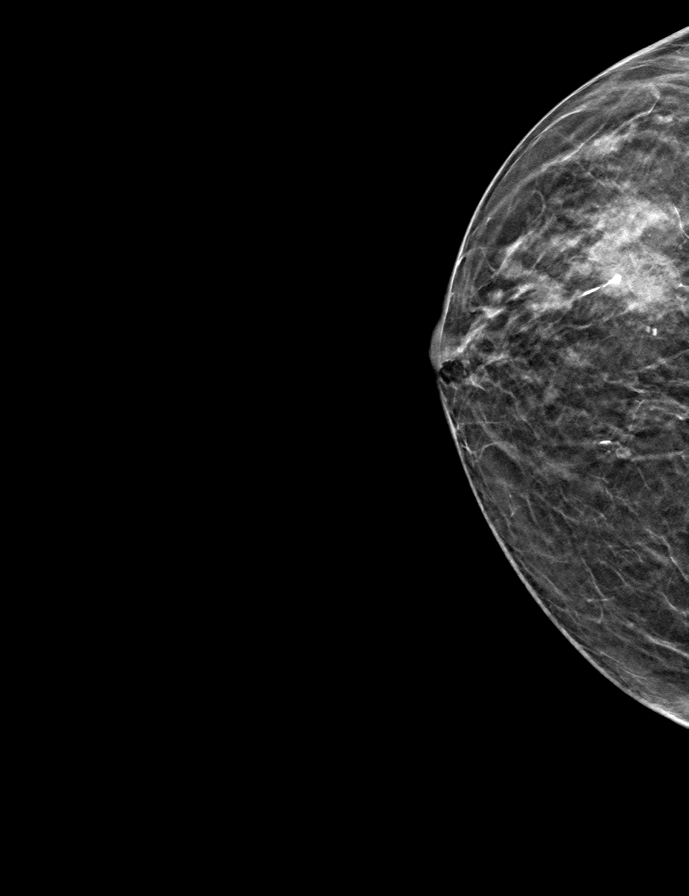

[L CC tomo · tomo slice 21/42.0]
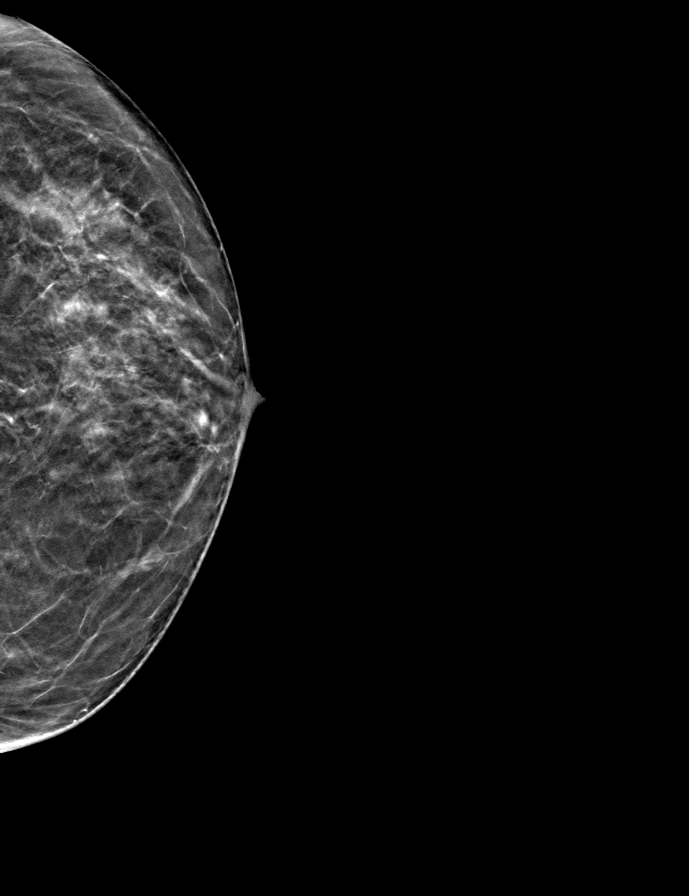

[8 of 24 positions shown; findings below may reference images not displayed]

ACR Breast Density Category b: There are scattered areas of
fibroglandular density.
FINDINGS: 2D/3D full field views of both breasts demonstrate resolution of 2
benign masses within the OUTER RIGHT breast previously demonstrated
as cysts.

Unchanged asymmetry within the OUTER RIGHT breast and unchanged
asymmetry within the anterior OUTER LEFT breast are again noted.

No new mammographic findings are noted.

Targeted ultrasound is performed, showing the following:

RIGHT breast:

A 0.2 x 0.2 x 0.2 cm hypoechoic mass at the 9 o'clock position 6 cm
from the nipple, previously measuring 0.3 x 0.3 x 0.6 cm.

LEFT breast:

A stable 0.5 x 0.2 x 0.6 cm circumscribed oval hypoechoic mass at
the 4 o'clock position 1 cm from the nipple.
IMPRESSION: 1. Stable 0.6 cm likely benign LOWER OUTER LEFT breast mass.
Follow-up in 16 months recommended to ensure 2 year stability.
2. Decreased size of OUTER RIGHT breast mass compatible with a
benign process.
3. Resolution of OUTER RIGHT breast cysts.
4. No new or suspicious mammographic findings within either breast.

RECOMMENDATION:
Bilateral diagnostic mammogram and LEFT breast ultrasound in 16
months to resume annual mammogram schedule and to reassess likely
benign LEFT breast finding.

I have discussed the findings and recommendations with the patient.
If applicable, a reminder letter will be sent to the patient
regarding the next appointment.

BI-RADS CATEGORY  3: Probably benign.

ADDENDUM:
RECOMMENDATION should read:

Bilateral diagnostic mammogram and LEFT breast ultrasound in 12
months.

*** End of Addendum ***
ACR Breast Density Category b: There are scattered areas of
fibroglandular density.
FINDINGS: 2D/3D full field views of both breasts demonstrate resolution of 2
benign masses within the OUTER RIGHT breast previously demonstrated
as cysts.

Unchanged asymmetry within the OUTER RIGHT breast and unchanged
asymmetry within the anterior OUTER LEFT breast are again noted.

No new mammographic findings are noted.

Targeted ultrasound is performed, showing the following:

RIGHT breast:

A 0.2 x 0.2 x 0.2 cm hypoechoic mass at the 9 o'clock position 6 cm
from the nipple, previously measuring 0.3 x 0.3 x 0.6 cm.

LEFT breast:

A stable 0.5 x 0.2 x 0.6 cm circumscribed oval hypoechoic mass at
the 4 o'clock position 1 cm from the nipple.
IMPRESSION: 1. Stable 0.6 cm likely benign LOWER OUTER LEFT breast mass.
Follow-up in 16 months recommended to ensure 2 year stability.
2. Decreased size of OUTER RIGHT breast mass compatible with a
benign process.
3. Resolution of OUTER RIGHT breast cysts.
4. No new or suspicious mammographic findings within either breast.

RECOMMENDATION:
Bilateral diagnostic mammogram and LEFT breast ultrasound in 16
months to resume annual mammogram schedule and to reassess likely
benign LEFT breast finding.

I have discussed the findings and recommendations with the patient.
If applicable, a reminder letter will be sent to the patient
regarding the next appointment.

BI-RADS CATEGORY  3: Probably benign.

## 2023-12-14 IMAGING — US US BREAST*L* LIMITED INC AXILLA
1 series · 5 of 5 positions shown · non-contrast
Comparison: Previous exam(s).
COMPARISON: Previous exam(s).

Addendum:
CLINICAL DATA: 70-year-old female for delayed six-month follow-up
of bilateral breast masses (now 9 months).



[Series 1: us breast*left* limited inc axilla · 0.05mm/px · 5 of 5 slices shown]
[im 1/5]
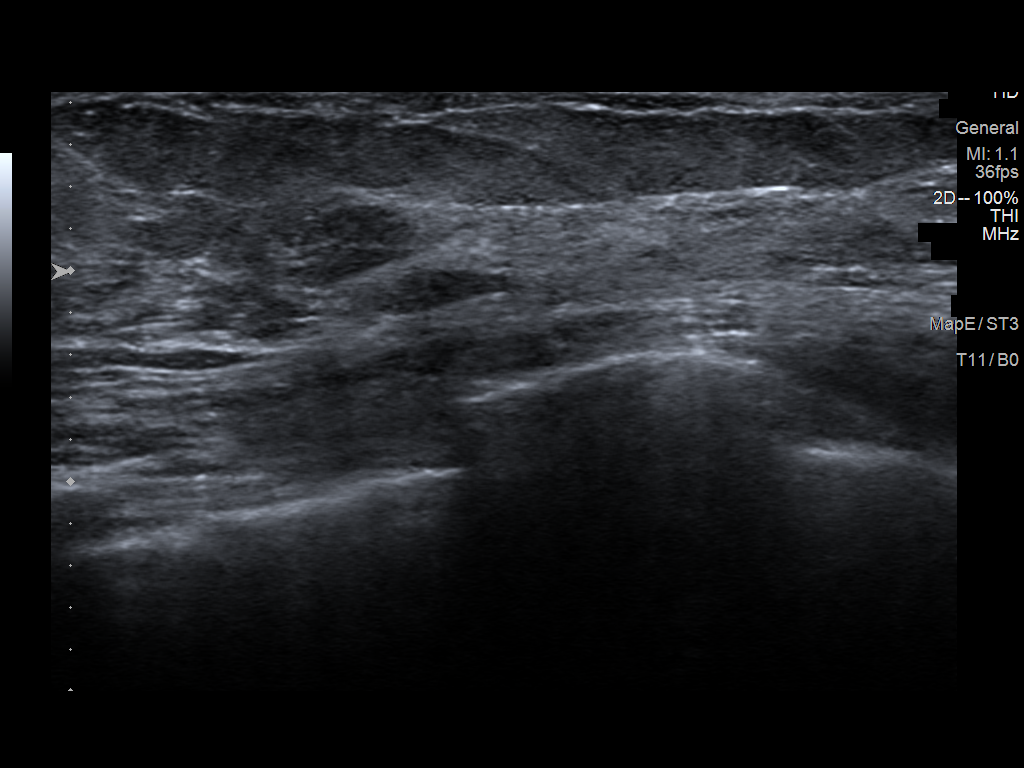
[im 2/5]
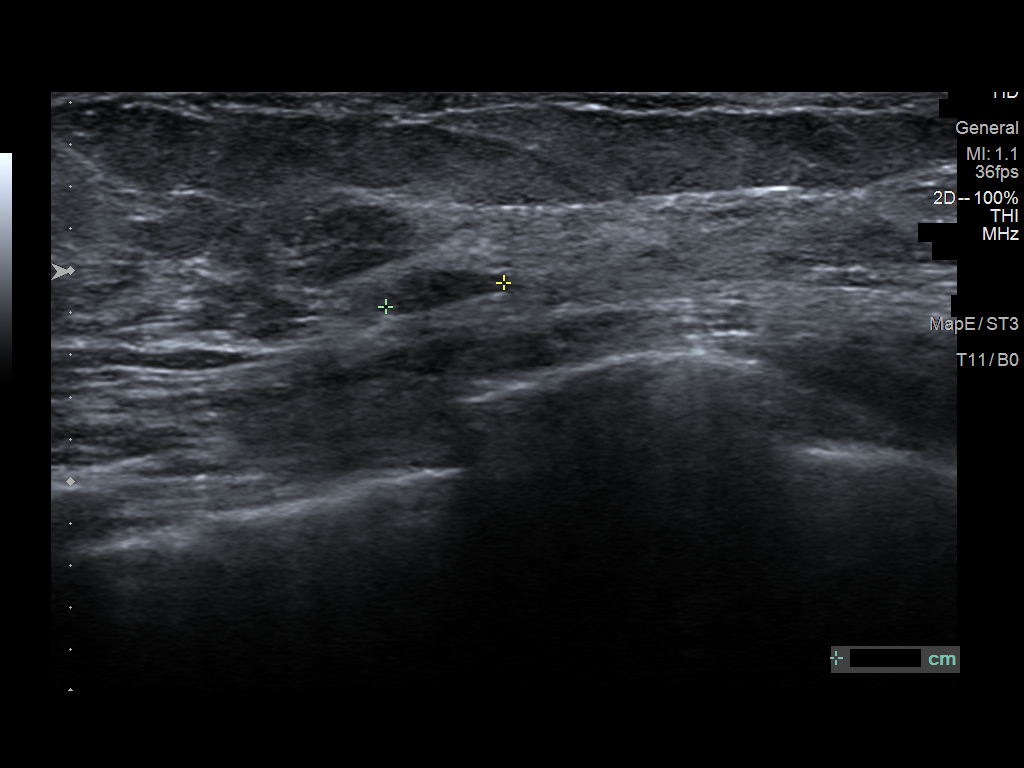
[im 3/5]
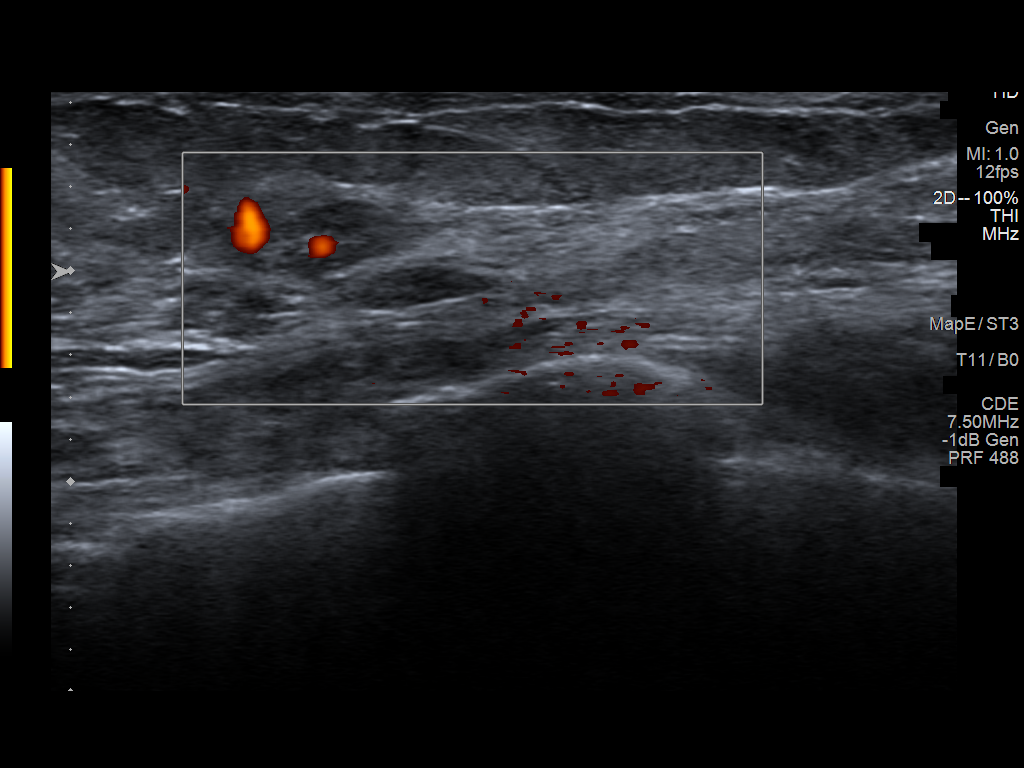
[im 4/5]
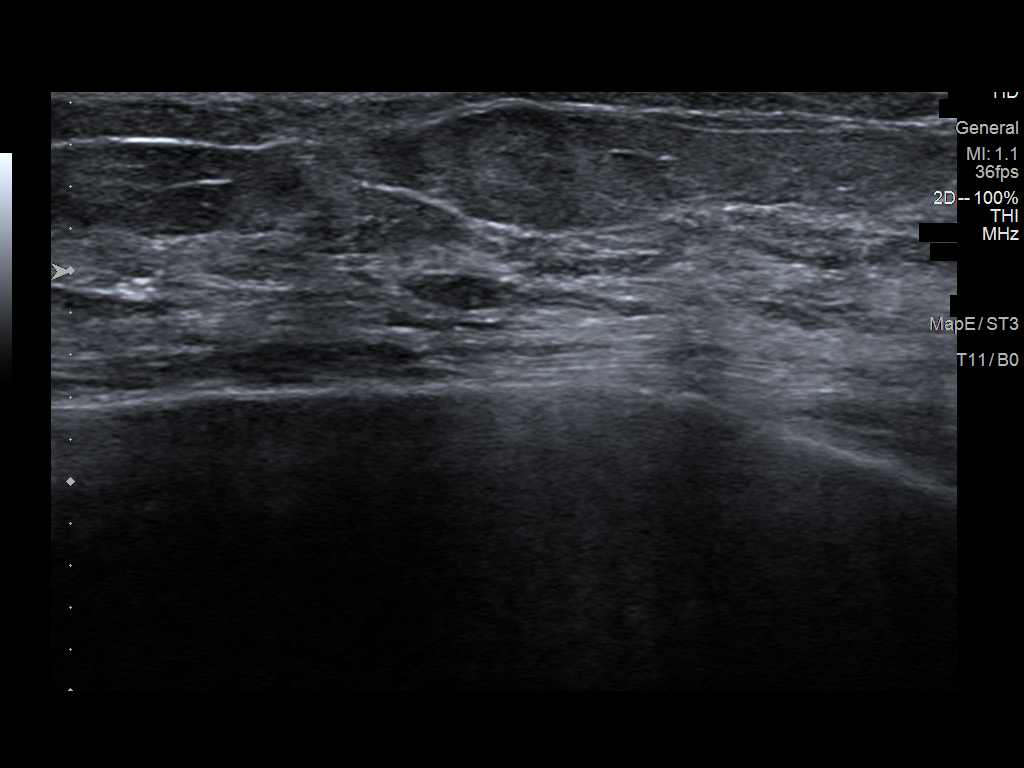
[im 5/5]
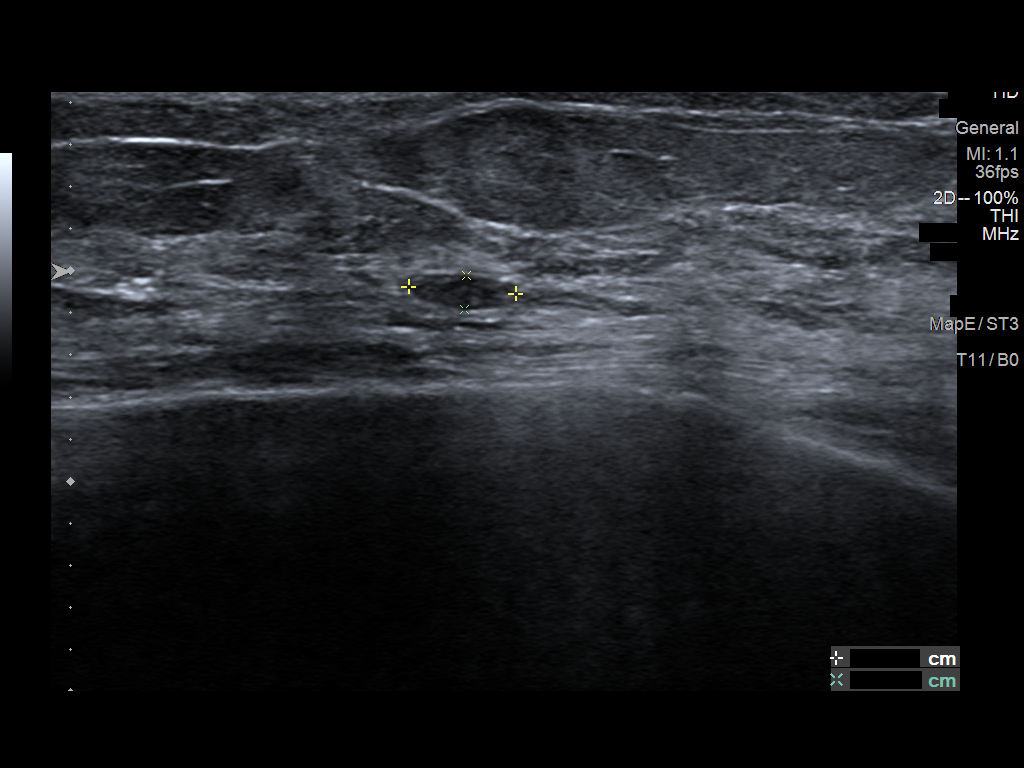

[5 of 5 positions shown; findings below may reference images not displayed]

ACR Breast Density Category b: There are scattered areas of
fibroglandular density.
FINDINGS: 2D/3D full field views of both breasts demonstrate resolution of 2
benign masses within the OUTER RIGHT breast previously demonstrated
as cysts.

Unchanged asymmetry within the OUTER RIGHT breast and unchanged
asymmetry within the anterior OUTER LEFT breast are again noted.

No new mammographic findings are noted.

Targeted ultrasound is performed, showing the following:

RIGHT breast:

A 0.2 x 0.2 x 0.2 cm hypoechoic mass at the 9 o'clock position 6 cm
from the nipple, previously measuring 0.3 x 0.3 x 0.6 cm.

LEFT breast:

A stable 0.5 x 0.2 x 0.6 cm circumscribed oval hypoechoic mass at
the 4 o'clock position 1 cm from the nipple.
IMPRESSION: 1. Stable 0.6 cm likely benign LOWER OUTER LEFT breast mass.
Follow-up in 16 months recommended to ensure 2 year stability.
2. Decreased size of OUTER RIGHT breast mass compatible with a
benign process.
3. Resolution of OUTER RIGHT breast cysts.
4. No new or suspicious mammographic findings within either breast.

RECOMMENDATION:
Bilateral diagnostic mammogram and LEFT breast ultrasound in 16
months to resume annual mammogram schedule and to reassess likely
benign LEFT breast finding.

I have discussed the findings and recommendations with the patient.
If applicable, a reminder letter will be sent to the patient
regarding the next appointment.

BI-RADS CATEGORY  3: Probably benign.

ADDENDUM:
RECOMMENDATION should read:

Bilateral diagnostic mammogram and LEFT breast ultrasound in 12
months.

*** End of Addendum ***
ACR Breast Density Category b: There are scattered areas of
fibroglandular density.
FINDINGS: 2D/3D full field views of both breasts demonstrate resolution of 2
benign masses within the OUTER RIGHT breast previously demonstrated
as cysts.

Unchanged asymmetry within the OUTER RIGHT breast and unchanged
asymmetry within the anterior OUTER LEFT breast are again noted.

No new mammographic findings are noted.

Targeted ultrasound is performed, showing the following:

RIGHT breast:

A 0.2 x 0.2 x 0.2 cm hypoechoic mass at the 9 o'clock position 6 cm
from the nipple, previously measuring 0.3 x 0.3 x 0.6 cm.

LEFT breast:

A stable 0.5 x 0.2 x 0.6 cm circumscribed oval hypoechoic mass at
the 4 o'clock position 1 cm from the nipple.
IMPRESSION: 1. Stable 0.6 cm likely benign LOWER OUTER LEFT breast mass.
Follow-up in 16 months recommended to ensure 2 year stability.
2. Decreased size of OUTER RIGHT breast mass compatible with a
benign process.
3. Resolution of OUTER RIGHT breast cysts.
4. No new or suspicious mammographic findings within either breast.

RECOMMENDATION:
Bilateral diagnostic mammogram and LEFT breast ultrasound in 16
months to resume annual mammogram schedule and to reassess likely
benign LEFT breast finding.

I have discussed the findings and recommendations with the patient.
If applicable, a reminder letter will be sent to the patient
regarding the next appointment.

BI-RADS CATEGORY  3: Probably benign.

## 2023-12-20 IMAGING — CT CT CHEST-ABD-PELV W/O CM
1 of 2 series · 14 of 32 positions shown, 18 images · non-contrast
Comparison: CT chest, abdomen, and pelvis 01/08/2021

CLINICAL DATA: 30 pound weight loss in 3 months. Possible lung
nodule seen on chest CT. Sjogren syndrome.



[Series 5: super d · axial · 0.70mm/px · z∈[-374,-94]mm · 14 of 384 slices shown, 18 images]
[im 17/384  soft-tissue]
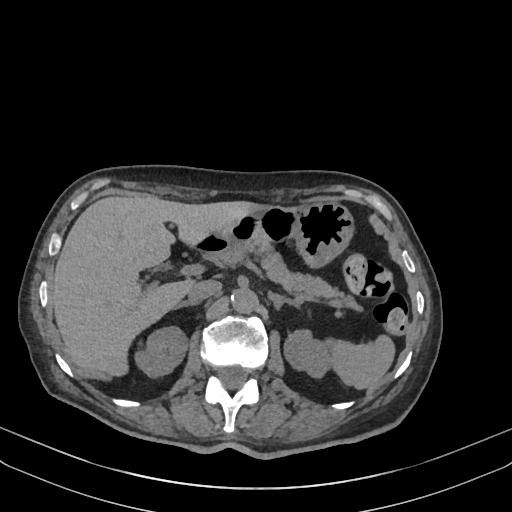
[im 17/384  bone]
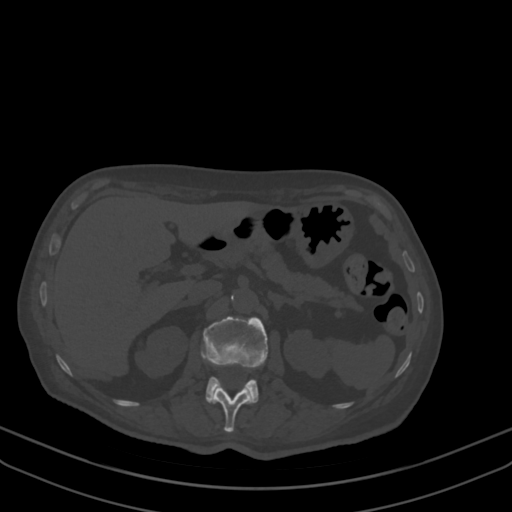
[im 50/384  soft-tissue]
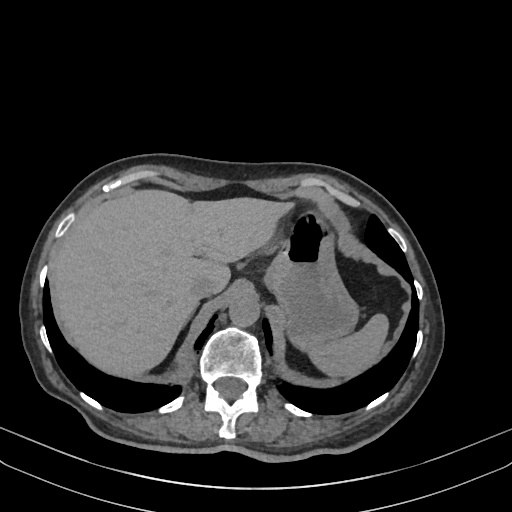
[im 84/384  soft-tissue]
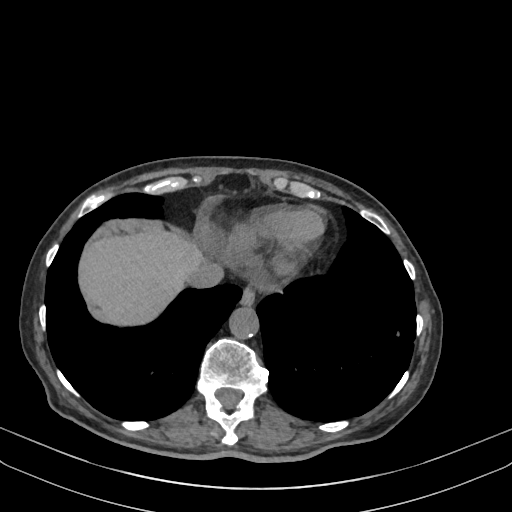
[im 117/384  soft-tissue]
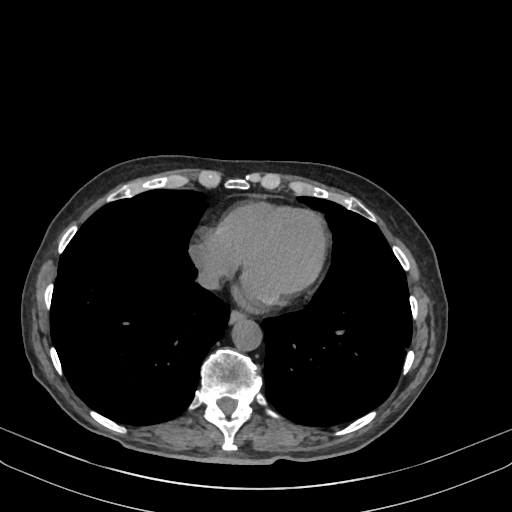
[im 150/384  soft-tissue]
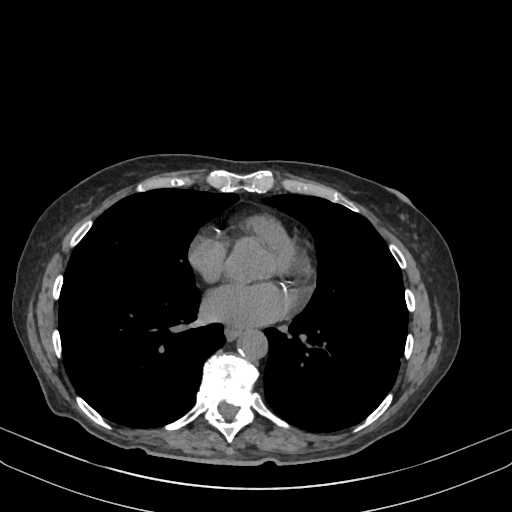
[im 184/384  soft-tissue]
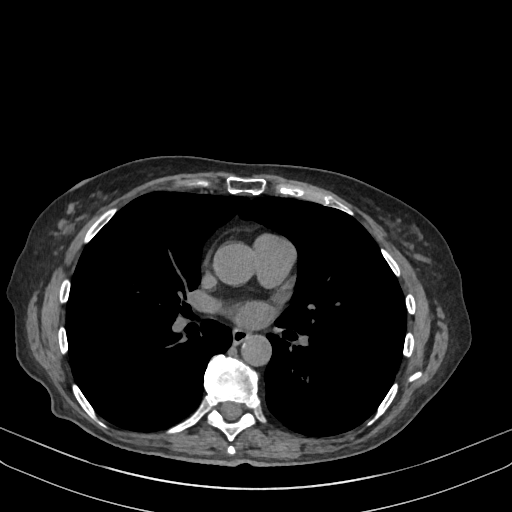
[im 200/384  soft-tissue]
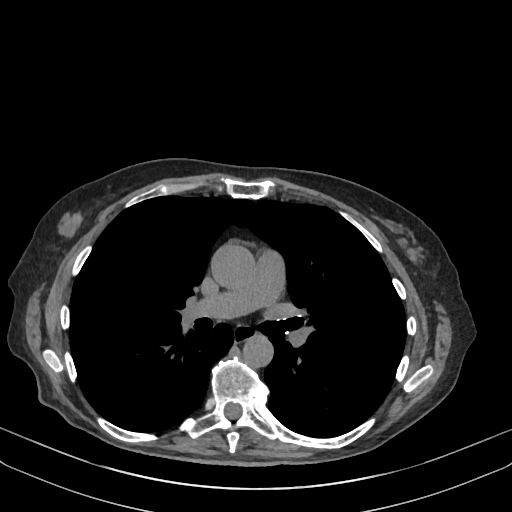
[im 234/384  soft-tissue]
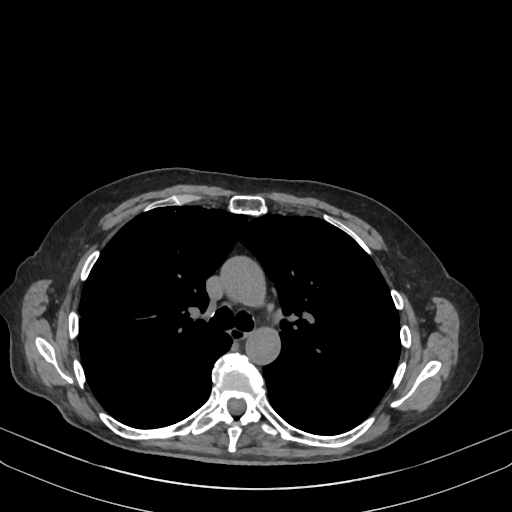
[im 267/384  soft-tissue]
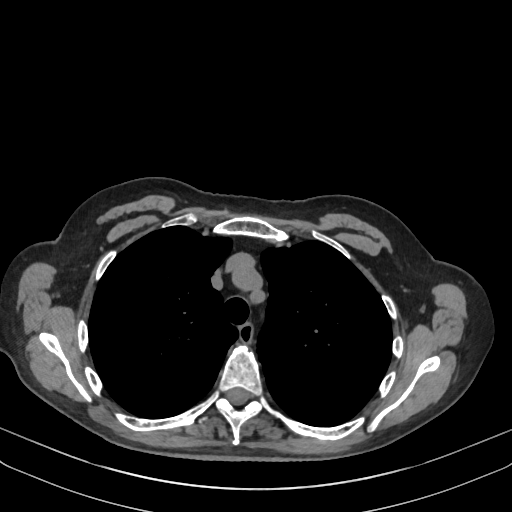
[im 267/384  bone]
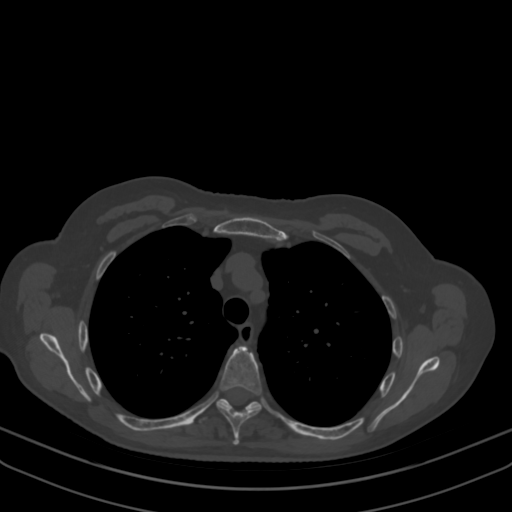
[im 300/384  soft-tissue]
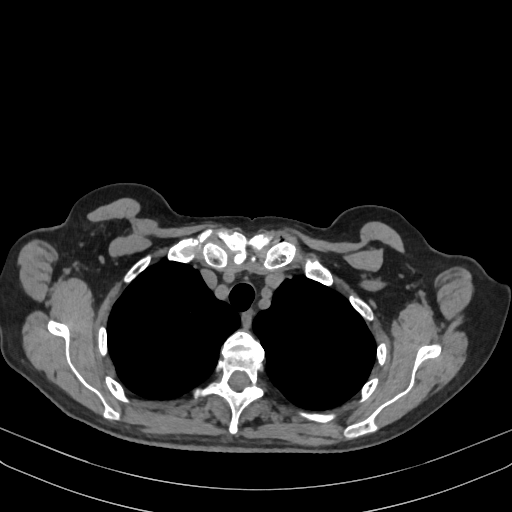
[im 317/384  lung]
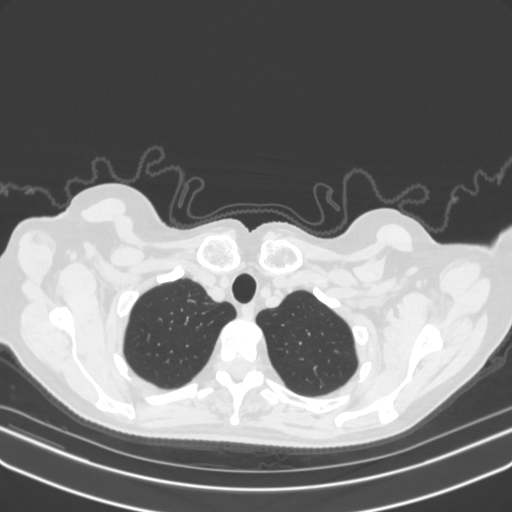
[im 334/384  soft-tissue]
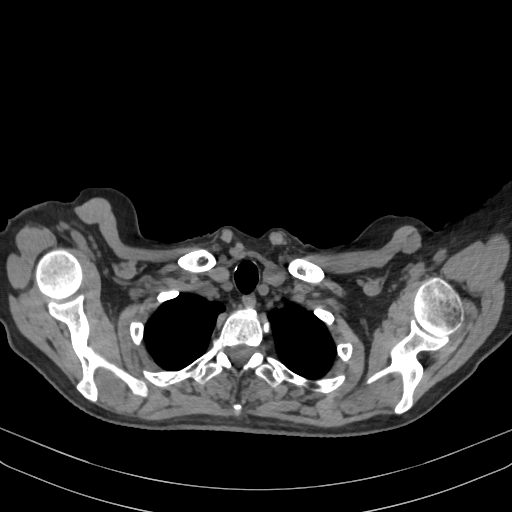
[im 334/384  lung]
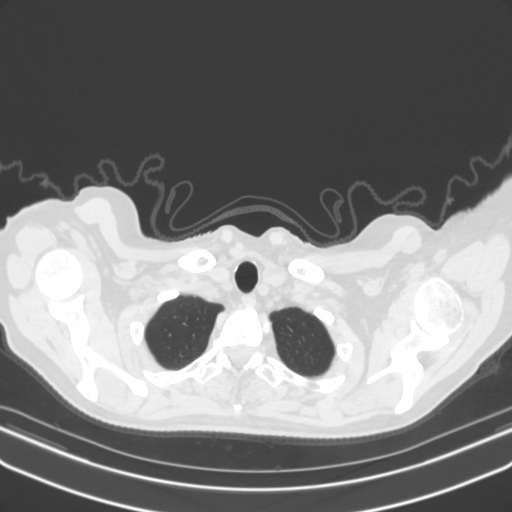
[im 350/384  lung]
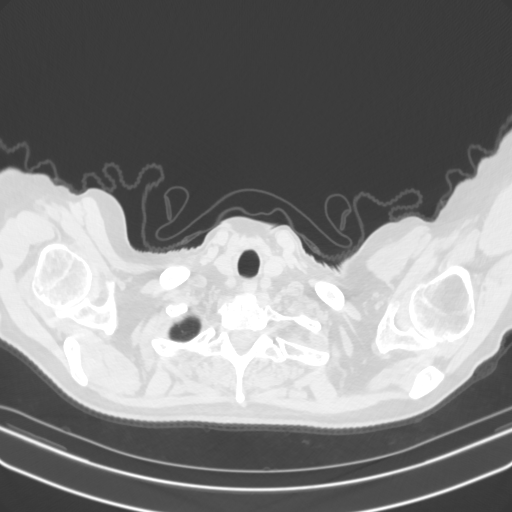
[im 367/384  soft-tissue]
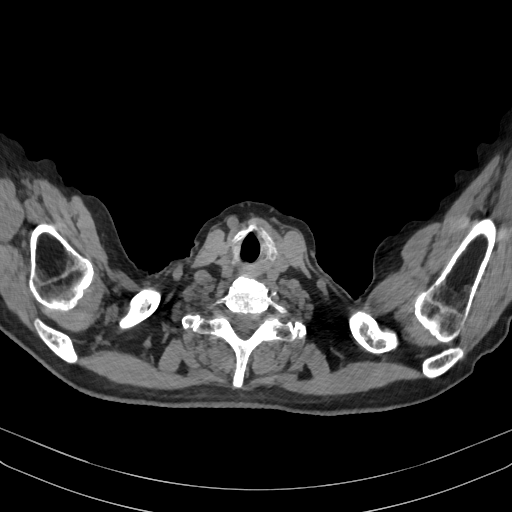
[im 367/384  lung]
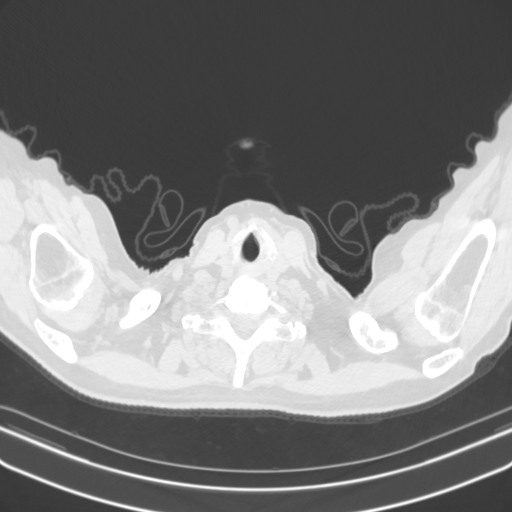

[14 of 32 positions shown; findings below may reference images not displayed]

FINDINGS: CT CHEST FINDINGS

Cardiovascular: Heart size is normal limits. Severe coronary
calcifications. Minimal simple pericardial effusion.

Mediastinum/Nodes: No enlarged chest lymph nodes.

Lungs/Pleura: No focal airspace opacity to indicate pneumonia.

Interval improvement of left lower lobe opacity. Residual linear
opacity in the left lower lobe most likely due to scarring.

Scattered punctate calcified pulmonary nodules likely due to prior
granulomatous inflammation.

Musculoskeletal: No chest wall mass or suspicious bone lesions
identified.

CT ABDOMEN PELVIS FINDINGS

Hepatobiliary: Minimal cholelithiasis. Multiple subcentimeter
hepatic hypodensities are too small to fully characterize.

10 mm lesion located in segment 2 of the left hepatic lobe appears
larger compared to the prior study where it measures 7 mm.

Pancreas: Unremarkable. No pancreatic ductal dilatation or
surrounding inflammatory changes.

Spleen: Normal in size without focal abnormality.

Adrenals/Urinary Tract: Adrenal glands are normal. Lobulated
appearance of the kidneys, unchanged from prior examination.
Kidneys, ureters, and bladder otherwise unremarkable. Evaluation the
bladder is limited by underdistention.

Stomach/Bowel: The wall of the distal rectum is diffusely thickened,
best seen on image 107 of series 2. This is new since prior
examination. Mild diverticulosis of the sigmoid colon without
evidence of acute diverticulitis. Appendix is normal. No bowel
dilatation to indicate ileus or obstruction.

Vascular/Lymphatic: No enlarged abdominal or pelvic lymph nodes.
Scattered calcified atheromatous plaque of the abdominal aorta.

Reproductive: Uterus and bilateral adnexa are unremarkable.

Other: No abdominal wall hernia or abnormality. No abdominopelvic
ascites.

Musculoskeletal: Severe asymmetric degenerative changes of the left
hip joint are again seen. These changes have progressed since the
prior examination. Degenerative changes seen throughout the lumbar
spine. No acute osseous abnormality. No lytic or blastic osseous
lesions.
IMPRESSION: 1. Annular thickening of the distal rectum is new since prior
examination. Correlation with direct visualization recommended as
this could represent malignancy.
2. 10 mm hypodense lesion in the left hepatic lobe (56/2) has
increased in size since prior study from 03/10/2021 where it
measured 7 mm. Findings are suspicious for metastatic disease in
could be better evaluated with contrast-enhanced abdominal MRI.

## 2024-02-03 ENCOUNTER — Other Ambulatory Visit: Payer: Self-pay | Admitting: Hematology and Oncology

## 2024-04-23 ENCOUNTER — Other Ambulatory Visit: Payer: Self-pay | Admitting: Hematology and Oncology

## 2024-04-23 ENCOUNTER — Inpatient Hospital Stay: Payer: Medicare Other | Attending: Hematology and Oncology

## 2024-04-23 ENCOUNTER — Inpatient Hospital Stay (HOSPITAL_BASED_OUTPATIENT_CLINIC_OR_DEPARTMENT_OTHER): Payer: Medicare Other | Admitting: Hematology and Oncology

## 2024-04-23 VITALS — BP 139/57 | HR 57 | Temp 98.3°F | Resp 17 | Ht 64.0 in | Wt 166.3 lb

## 2024-04-23 DIAGNOSIS — I2602 Saddle embolus of pulmonary artery with acute cor pulmonale: Secondary | ICD-10-CM

## 2024-04-23 DIAGNOSIS — Z86711 Personal history of pulmonary embolism: Secondary | ICD-10-CM | POA: Diagnosis not present

## 2024-04-23 DIAGNOSIS — K7689 Other specified diseases of liver: Secondary | ICD-10-CM | POA: Insufficient documentation

## 2024-04-23 DIAGNOSIS — R634 Abnormal weight loss: Secondary | ICD-10-CM | POA: Diagnosis not present

## 2024-04-23 DIAGNOSIS — Z86718 Personal history of other venous thrombosis and embolism: Secondary | ICD-10-CM | POA: Diagnosis not present

## 2024-04-23 DIAGNOSIS — Z7901 Long term (current) use of anticoagulants: Secondary | ICD-10-CM | POA: Insufficient documentation

## 2024-04-23 LAB — CMP (CANCER CENTER ONLY)
ALT: 14 U/L (ref 0–44)
AST: 13 U/L — ABNORMAL LOW (ref 15–41)
Albumin: 4.1 g/dL (ref 3.5–5.0)
Alkaline Phosphatase: 71 U/L (ref 38–126)
Anion gap: 7 (ref 5–15)
BUN: 13 mg/dL (ref 8–23)
CO2: 27 mmol/L (ref 22–32)
Calcium: 9.3 mg/dL (ref 8.9–10.3)
Chloride: 107 mmol/L (ref 98–111)
Creatinine: 1.18 mg/dL — ABNORMAL HIGH (ref 0.44–1.00)
GFR, Estimated: 49 mL/min — ABNORMAL LOW (ref >60)
Glucose, Bld: 109 mg/dL — ABNORMAL HIGH (ref 70–99)
Potassium: 4.6 mmol/L (ref 3.5–5.1)
Sodium: 141 mmol/L (ref 135–145)
Total Bilirubin: 0.9 mg/dL (ref 0.0–1.2)
Total Protein: 6.6 g/dL (ref 6.5–8.1)

## 2024-04-23 LAB — CBC WITH DIFFERENTIAL (CANCER CENTER ONLY)
Abs Immature Granulocytes: 0.02 10*3/uL (ref 0.00–0.07)
Basophils Absolute: 0.1 10*3/uL (ref 0.0–0.1)
Basophils Relative: 1 %
Eosinophils Absolute: 0.1 10*3/uL (ref 0.0–0.5)
Eosinophils Relative: 2 %
HCT: 38.7 % (ref 36.0–46.0)
Hemoglobin: 13.2 g/dL (ref 12.0–15.0)
Immature Granulocytes: 0 %
Lymphocytes Relative: 24 %
Lymphs Abs: 1.4 10*3/uL (ref 0.7–4.0)
MCH: 33.2 pg (ref 26.0–34.0)
MCHC: 34.1 g/dL (ref 30.0–36.0)
MCV: 97.2 fL (ref 80.0–100.0)
Monocytes Absolute: 0.4 10*3/uL (ref 0.1–1.0)
Monocytes Relative: 7 %
Neutro Abs: 3.9 10*3/uL (ref 1.7–7.7)
Neutrophils Relative %: 66 %
Platelet Count: 192 10*3/uL (ref 150–400)
RBC: 3.98 MIL/uL (ref 3.87–5.11)
RDW: 12.2 % (ref 11.5–15.5)
WBC Count: 5.9 10*3/uL (ref 4.0–10.5)
nRBC: 0 % (ref 0.0–0.2)

## 2024-04-23 NOTE — Progress Notes (Signed)
 [facility] [phone]   [phone]  PROGRESS NOTE  Patient Care Team: Perley Bradley, MD as PCP - General (Family Medicine)  Hematological/Oncological History # Saddle Pulmonary Embolism 03/10/2021: presented to the ED with left sided rib pain. CTA pulmonary study showed large pulmonary embolus including central and peripheral saddle emboli with heart strain. Started on Eliquis  5 mg BID 05/12/2021: establish care with Dr. Rosaline Coma  09/21/2021: transition to maintenance dose eliquis  2.5 mg BID   Interval History:  Denise Weiss 73 y.o. female with medical history significant for saddle pulmonary embolis who presents for a follow up visit. The patient's last visit was on 10/21/2023. In the interim since the last visit she has continued on PO eliquis  therapy 2.5 mg BID.  On exam today Denise Weiss reports everything has worsened in the interim since her last visit.  She is having worsening of her scleroderma and lymphedema bilaterally.  She is having lot of digestive issues.  Whenever she eats she gets bloating followed by diarrhea.  She reports that she has hyperacidity.  She notes that she sometimes has difficulty controlling her bowels.  She reports that she has not been able to exercise is much as she would like and is gaining weight.  She is continue to take her Eliquis  as prescribed 2.5 mg twice daily.  She does have swelling of her lower extremities bilaterally.  She is not having any shortness of breath except on exertion.  She reports that she is raising some baby kittens and they are scratching her lower extremities.  She is able to control the bleeding with minimal difficulty.  Eliquis  currently cost $0 per month.  Otherwise she denies any fevers, chills, sweats, or other infectious symptoms.  Full 10 point ROS is otherwise negative.  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)    Scleroderma (HCC)    Sjogren's syndrome (HCC)    Stage 3a chronic kidney disease (HCC)    Tremor     SURGICAL HISTORY: Past Surgical History:  Procedure Laterality Date   CESAREAN SECTION     KNEE SURGERY     TONSILLECTOMY      SOCIAL HISTORY: Social History   Socioeconomic History   Marital status: Single    Spouse name: Not on file   Number of children: Not on file   Years of education: Not on file   Highest education level: Not on file  Occupational History   Occupation: retired  Tobacco Use   Smoking status: Never   Smokeless tobacco: Never  Vaping Use   Vaping status: Never Used  Substance and Sexual Activity   Alcohol use: Not Currently   Drug use: Never   Sexual activity: Not on file  Other Topics Concern   Not on file  Social History Narrative   Not on file   Social Drivers of Health   Financial Resource Strain: Not on file  Food Insecurity: Not on file  Transportation Needs: Not on file  Physical Activity: Not on file  Stress: Not on file  Social Connections: Not on file  Intimate Partner Violence: Not on file    FAMILY HISTORY: Family History  Problem Relation Age of Onset   Hepatitis C Mother  Heart attack Father    Schizophrenia Sister     ALLERGIES:  has no known allergies.  MEDICATIONS:  Current Outpatient Medications  Medication Sig Dispense Refill   acetaminophen  (TYLENOL ) 650 MG CR tablet Take 650 mg by mouth every 8 (eight) hours as needed for pain.     ALPRAZolam  (XANAX ) 0.25 MG tablet Take 0.25-0.5 mg by mouth daily as needed for sleep.     atenolol  (TENORMIN ) 50 MG tablet Take 50 mg by mouth 2 (two) times daily.     atorvastatin  (LIPITOR) 20 MG tablet Take 20 mg by mouth daily.     ELIQUIS  2.5 MG TABS tablet TAKE 1 TABLET(2.5 MG) BY MOUTH TWICE DAILY 180 tablet 2   levothyroxine  (SYNTHROID ) 100 MCG  tablet Take 100 mcg by mouth every morning.     losartan (COZAAR) 25 MG tablet Take 50 mg by mouth daily.     Multiple Vitamin (MULTIVITAMIN WITH MINERALS) TABS tablet Take 1 tablet by mouth daily.     omeprazole (PRILOSEC) 20 MG capsule Take 20 mg by mouth daily.     traZODone (DESYREL) 50 MG tablet Take 50 mg by mouth at bedtime. Take 1-3 tablets at bedtime as needed     triamcinolone cream (KENALOG) 0.1 % Apply 1 application topically 2 (two) times daily.     TRINTELLIX  5 MG TABS tablet Take 5 mg by mouth daily.     No current facility-administered medications for this visit.    REVIEW OF SYSTEMS:   Constitutional: ( - ) fevers, ( - )  chills , ( - ) night sweats Eyes: ( - ) blurriness of vision, ( - ) double vision, ( - ) watery eyes Ears, nose, mouth, throat, and face: ( - ) mucositis, ( - ) sore throat Respiratory: ( - ) cough, ( - ) dyspnea, ( - ) wheezes Cardiovascular: ( - ) palpitation, ( - ) chest discomfort, ( - ) lower extremity swelling Gastrointestinal:  ( - ) nausea, ( - ) heartburn, ( - ) change in bowel habits Skin: ( - ) abnormal skin rashes Lymphatics: ( - ) new lymphadenopathy, ( - ) easy bruising Neurological: ( - ) numbness, ( - ) tingling, ( - ) new weaknesses Behavioral/Psych: ( - ) mood change, ( - ) new changes  All other systems were reviewed with the patient and are negative.  PHYSICAL EXAMINATION: ECOG PERFORMANCE STATUS: 1 - Symptomatic but completely ambulatory  Vitals:   04/23/24 1455  BP: (!) 139/57  Pulse: (!) 57  Resp: 17  Temp: 98.3 F (36.8 C)  SpO2: 96%       Filed Weights   04/23/24 1455  Weight: 166 lb 4.8 oz (75.4 kg)        GENERAL: Well-appearing elderly Caucasian female, alert, no distress and comfortable SKIN: skin color, texture, turgor are normal, no rashes or significant lesions EYES: conjunctiva are pink and non-injected, sclera clear LUNGS: clear to auscultation and percussion with normal breathing effort HEART:  regular rate & rhythm and no murmurs and no lower extremity edema Musculoskeletal: no cyanosis of digits and no clubbing  PSYCH: alert & oriented x 3, fluent speech NEURO: no focal motor/sensory deficits  LABORATORY DATA:  I have reviewed the data as listed    Latest Ref Rng & Units 04/23/2024    2:34 PM 10/21/2023    2:24 PM 04/22/2023    2:07 PM  CBC  WBC 4.0 - 10.5 K/uL 5.9  6.2  7.0  Hemoglobin 12.0 - 15.0 g/dL 16.1  09.6  04.5   Hematocrit 36.0 - 46.0 % 38.7  41.3  38.2   Platelets 150 - 400 K/uL 192  220  214        Latest Ref Rng & Units 04/23/2024    2:34 PM 10/21/2023    2:24 PM 04/22/2023    2:07 PM  CMP  Glucose 70 - 99 mg/dL 409  811  914   BUN 8 - 23 mg/dL 13  15  12    Creatinine 0.44 - 1.00 mg/dL 7.82  9.56  2.13   Sodium 135 - 145 mmol/L 141  139  140   Potassium 3.5 - 5.1 mmol/L 4.6  4.5  4.5   Chloride 98 - 111 mmol/L 107  103  104   CO2 22 - 32 mmol/L 27  28  31    Calcium  8.9 - 10.3 mg/dL 9.3  9.9  9.6   Total Protein 6.5 - 8.1 g/dL 6.6  6.8  6.3   Total Bilirubin 0.0 - 1.2 mg/dL 0.9  1.0  1.1   Alkaline Phos 38 - 126 U/L 71  67  63   AST 15 - 41 U/L 13  18  17    ALT 0 - 44 U/L 14  19  20      RADIOGRAPHIC STUDIES: No results found.  ASSESSMENT & PLAN Denise Weiss 73 y.o. female with medical history significant for saddle pulmonary embolis who presents for a follow up visit.  After review of the labs, review of the records, and discussion with the patient the patients findings are most consistent with a massive unprovoked saddle pulmonary embolism.  Given the unprovoked nature of this VTE and her prior history of VTE I would recommend that she continue on lifelong anticoagulation.  The recommendation would be for full-strength Eliquis  5 mg twice daily for at least 6 months time.  After that she could consider transition to maintenance Eliquis  2.5 mg twice daily.  We had a discussion on 09/21/2021 and she was agreeable to transitioning down to the lower  dose of maintenance therapy.  As long as the patient is tolerating the medication well without too much financial difficulty we can continue this therapy.  If there were to be any issues with the medicine we recommend she reach out to our clinic prior to her follow-up visit in 14-months.  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  Due to this I would recommend indefinite lifelong anticoagulation. --Continue Eliquis  2.5 mg twice daily for thromboprophylaxis. --Patient has completed 6 full months of full-strength anticoagulation therapy, she was agreeable to transition to maintenance Eliquis  2.5 mg twice daily --If the patient develops bleeding, intolerance, or the medication is financially difficult to continue would recommend the patient reach out to our clinic for alternative options. --labs today show white blood cell 5.9, hemoglobin 13.2, MCV 97.2, platelets 192.  Creatinine LFTs within normal limits --We will have the patient return to clinic in 6 months time to assure continued tolerance of anticoagulation therapy.   # Liver Lesion # Weight Loss--improved  # Possible Metastatic Disease -- Noted on prior CT scan --I encourage patient to pursue an MRI liver in order to further assess.  She noted she does not wish to have this done as she does not want to subsequent workup to follow afterwards.  She notes that she is a  DNR/DNI and does not wish to pursue this workup because she would not want to do anything with the results.  No orders of the defined types were placed in this encounter.  All questions were answered. The patient knows to call the clinic with any problems, questions or concerns.  A total of more than 25 minutes were spent on this encounter with face-to-face time and non-face-to-face time, including preparing to see the patient, ordering tests and/or medications, counseling  the patient and coordination of care as outlined above.   Rogerio Clay, MD Department of Hematology/Oncology [facility] at [facility] Phone: [phone] Pager: [phone] Email: [email or web address]  04/23/2024 4:53 PM

## 2024-10-23 ENCOUNTER — Inpatient Hospital Stay: Admitting: Hematology and Oncology

## 2024-10-23 ENCOUNTER — Other Ambulatory Visit: Payer: Self-pay | Admitting: *Deleted

## 2024-10-23 ENCOUNTER — Inpatient Hospital Stay: Attending: Hematology and Oncology

## 2024-10-23 VITALS — BP 167/55 | HR 65 | Temp 97.8°F | Resp 13 | Wt 177.5 lb

## 2024-10-23 DIAGNOSIS — I2602 Saddle embolus of pulmonary artery with acute cor pulmonale: Secondary | ICD-10-CM

## 2024-10-23 DIAGNOSIS — Z7901 Long term (current) use of anticoagulants: Secondary | ICD-10-CM | POA: Diagnosis not present

## 2024-10-23 DIAGNOSIS — Z86711 Personal history of pulmonary embolism: Secondary | ICD-10-CM | POA: Diagnosis present

## 2024-10-23 DIAGNOSIS — R634 Abnormal weight loss: Secondary | ICD-10-CM | POA: Diagnosis not present

## 2024-10-23 DIAGNOSIS — K769 Liver disease, unspecified: Secondary | ICD-10-CM | POA: Insufficient documentation

## 2024-10-23 DIAGNOSIS — D500 Iron deficiency anemia secondary to blood loss (chronic): Secondary | ICD-10-CM

## 2024-10-23 DIAGNOSIS — Z66 Do not resuscitate: Secondary | ICD-10-CM | POA: Insufficient documentation

## 2024-10-23 DIAGNOSIS — Z86718 Personal history of other venous thrombosis and embolism: Secondary | ICD-10-CM | POA: Diagnosis not present

## 2024-10-23 LAB — CBC WITH DIFFERENTIAL (CANCER CENTER ONLY)
Abs Immature Granulocytes: 0.04 K/uL (ref 0.00–0.07)
Basophils Absolute: 0.1 K/uL (ref 0.0–0.1)
Basophils Relative: 1 %
Eosinophils Absolute: 0.2 K/uL (ref 0.0–0.5)
Eosinophils Relative: 3 %
HCT: 39 % (ref 36.0–46.0)
Hemoglobin: 13.5 g/dL (ref 12.0–15.0)
Immature Granulocytes: 1 %
Lymphocytes Relative: 18 %
Lymphs Abs: 1.5 K/uL (ref 0.7–4.0)
MCH: 33.3 pg (ref 26.0–34.0)
MCHC: 34.6 g/dL (ref 30.0–36.0)
MCV: 96.1 fL (ref 80.0–100.0)
Monocytes Absolute: 0.5 K/uL (ref 0.1–1.0)
Monocytes Relative: 6 %
Neutro Abs: 5.8 K/uL (ref 1.7–7.7)
Neutrophils Relative %: 71 %
Platelet Count: 228 K/uL (ref 150–400)
RBC: 4.06 MIL/uL (ref 3.87–5.11)
RDW: 12.9 % (ref 11.5–15.5)
WBC Count: 8.1 K/uL (ref 4.0–10.5)
nRBC: 0 % (ref 0.0–0.2)

## 2024-10-23 LAB — CMP (CANCER CENTER ONLY)
ALT: 16 U/L (ref 0–44)
AST: 19 U/L (ref 15–41)
Albumin: 4.3 g/dL (ref 3.5–5.0)
Alkaline Phosphatase: 88 U/L (ref 38–126)
Anion gap: 12 (ref 5–15)
BUN: 16 mg/dL (ref 8–23)
CO2: 24 mmol/L (ref 22–32)
Calcium: 9.4 mg/dL (ref 8.9–10.3)
Chloride: 104 mmol/L (ref 98–111)
Creatinine: 1.43 mg/dL — ABNORMAL HIGH (ref 0.44–1.00)
GFR, Estimated: 38 mL/min — ABNORMAL LOW
Glucose, Bld: 102 mg/dL — ABNORMAL HIGH (ref 70–99)
Potassium: 4.9 mmol/L (ref 3.5–5.1)
Sodium: 139 mmol/L (ref 135–145)
Total Bilirubin: 0.7 mg/dL (ref 0.0–1.2)
Total Protein: 6.8 g/dL (ref 6.5–8.1)

## 2024-10-23 NOTE — Progress Notes (Unsigned)
 " [facility] Telephone:([phone]   [phone]  PROGRESS NOTE  Patient Care Team: Cleotilde Planas, MD as PCP - General (Family Medicine)  Hematological/Oncological History # Saddle Pulmonary Embolism 03/10/2021: presented to the ED with left sided rib pain. CTA pulmonary study showed large pulmonary embolus including central and peripheral saddle emboli with heart strain. Started on Eliquis  5 mg BID 05/12/2021: establish care with Dr. Federico  09/21/2021: transition to maintenance dose eliquis  2.5 mg BID   Interval History:  Denise Weiss 73 y.o. female with medical history significant for saddle pulmonary embolis who presents for a follow up visit. The patient's last visit was on 04/23/2024. In the interim since the last visit she has continued on PO eliquis  therapy 2.5 mg BID.  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)    Scleroderma (HCC)    Sjogren's syndrome    Stage 3a chronic kidney disease (HCC)    Tremor     SURGICAL HISTORY: Past Surgical History:  Procedure Laterality Date   CESAREAN SECTION     KNEE SURGERY     TONSILLECTOMY      SOCIAL HISTORY: Social History   Socioeconomic History   Marital status: Single    Spouse name: Not on file   Number of children: Not on file   Years of education: Not on file   Highest education level: Not on file  Occupational History   Occupation: retired  Tobacco Use   Smoking status: Never   Smokeless tobacco: Never  Vaping Use   Vaping status: Never Used  Substance and Sexual Activity   Alcohol use: Not Currently   Drug use: Never   Sexual activity: Not on file  Other Topics Concern   Not on file  Social History Narrative   Not on file   Social Drivers of  Health   Tobacco Use: Low Risk (01/12/2022)   Patient History    Smoking Tobacco Use: Never    Smokeless Tobacco Use: Never    Passive Exposure: Not on file  Financial Resource Strain: Not on file  Food Insecurity: Not on file  Transportation Needs: Not on file  Physical Activity: Not on file  Stress: Not on file  Social Connections: Not on file  Intimate Partner Violence: Not on file  Depression (EYV7-0): Not on file  Alcohol Screen: Not on file  Housing: Not on file  Utilities: Not on file  Health Literacy: Not on file    FAMILY HISTORY: Family History  Problem Relation Age of Onset   Hepatitis C Mother    Heart attack Father    Schizophrenia Sister     ALLERGIES:  has no known allergies.  MEDICATIONS:  Current Outpatient Medications  Medication Sig Dispense Refill   acetaminophen  (TYLENOL ) 650 MG CR tablet Take 650 mg by mouth every 8 (eight) hours as needed for pain.     ALPRAZolam  (XANAX ) 0.25 MG tablet Take 0.25-0.5 mg by mouth daily as needed for sleep.     atenolol  (TENORMIN ) 50 MG tablet Take 50 mg by mouth 2 (two) times daily.     atorvastatin  (LIPITOR) 20 MG tablet Take 20 mg by mouth daily.     ELIQUIS   2.5 MG TABS tablet TAKE 1 TABLET(2.5 MG) BY MOUTH TWICE DAILY 180 tablet 2   levothyroxine  (SYNTHROID ) 100 MCG tablet Take 100 mcg by mouth every morning.     losartan (COZAAR) 25 MG tablet Take 50 mg by mouth daily.     Multiple Vitamin (MULTIVITAMIN WITH MINERALS) TABS tablet Take 1 tablet by mouth daily.     omeprazole (PRILOSEC) 20 MG capsule Take 20 mg by mouth daily.     traZODone (DESYREL) 50 MG tablet Take 50 mg by mouth at bedtime. Take 1-3 tablets at bedtime as needed     triamcinolone cream (KENALOG) 0.1 % Apply 1 application topically 2 (two) times daily.     TRINTELLIX  5 MG TABS tablet Take 5 mg by mouth daily.     No current facility-administered medications for this visit.    REVIEW OF SYSTEMS:   Constitutional: ( - ) fevers, ( - )  chills ,  ( - ) night sweats Eyes: ( - ) blurriness of vision, ( - ) double vision, ( - ) watery eyes Ears, nose, mouth, throat, and face: ( - ) mucositis, ( - ) sore throat Respiratory: ( - ) cough, ( - ) dyspnea, ( - ) wheezes Cardiovascular: ( - ) palpitation, ( - ) chest discomfort, ( - ) lower extremity swelling Gastrointestinal:  ( - ) nausea, ( - ) heartburn, ( - ) change in bowel habits Skin: ( - ) abnormal skin rashes Lymphatics: ( - ) new lymphadenopathy, ( - ) easy bruising Neurological: ( - ) numbness, ( - ) tingling, ( - ) new weaknesses Behavioral/Psych: ( - ) mood change, ( - ) new changes  All other systems were reviewed with the patient and are negative.  PHYSICAL EXAMINATION: ECOG PERFORMANCE STATUS: 1 - Symptomatic but completely ambulatory  There were no vitals filed for this visit. There were no vitals filed for this visit.  GENERAL: Well-appearing elderly Caucasian female, alert, no distress and comfortable SKIN: skin color, texture, turgor are normal, no rashes or significant lesions EYES: conjunctiva are pink and non-injected, sclera clear LUNGS: clear to auscultation and percussion with normal breathing effort HEART: regular rate & rhythm and no murmurs and no lower extremity edema Musculoskeletal: no cyanosis of digits and no clubbing  PSYCH: alert & oriented x 3, fluent speech NEURO: no focal motor/sensory deficits  LABORATORY DATA:  I have reviewed the data as listed    Latest Ref Rng & Units 04/23/2024    2:34 PM 10/21/2023    2:24 PM 04/22/2023    2:07 PM  CBC  WBC 4.0 - 10.5 K/uL 5.9  6.2  7.0   Hemoglobin 12.0 - 15.0 g/dL 86.7  86.1  86.6   Hematocrit 36.0 - 46.0 % 38.7  41.3  38.2   Platelets 150 - 400 K/uL 192  220  214        Latest Ref Rng & Units 04/23/2024    2:34 PM 10/21/2023    2:24 PM 04/22/2023    2:07 PM  CMP  Glucose 70 - 99 mg/dL 890  891  891   BUN 8 - 23 mg/dL 13  15  12    Creatinine 0.44 - 1.00 mg/dL 8.81  8.80  8.87   Sodium 135 -  145 mmol/L 141  139  140   Potassium 3.5 - 5.1 mmol/L 4.6  4.5  4.5   Chloride 98 - 111 mmol/L 107  103  104   CO2 22 -  32 mmol/L 27  28  31    Calcium  8.9 - 10.3 mg/dL 9.3  9.9  9.6   Total Protein 6.5 - 8.1 g/dL 6.6  6.8  6.3   Total Bilirubin 0.0 - 1.2 mg/dL 0.9  1.0  1.1   Alkaline Phos 38 - 126 U/L 71  67  63   AST 15 - 41 U/L 13  18  17    ALT 0 - 44 U/L 14  19  20      RADIOGRAPHIC STUDIES: No results found.  ASSESSMENT & PLAN Denise Weiss 73 y.o. female with medical history significant for saddle pulmonary embolis who presents for a follow up visit.  After review of the labs, review of the records, and discussion with the patient the patients findings are most consistent with a massive unprovoked saddle pulmonary embolism.  Given the unprovoked nature of this VTE and her prior history of VTE I would recommend that she continue on lifelong anticoagulation.  The recommendation would be for full-strength Eliquis  5 mg twice daily for at least 6 months time.  After that she could consider transition to maintenance Eliquis  2.5 mg twice daily.  We had a discussion on 09/21/2021 and she was agreeable to transitioning down to the lower dose of maintenance therapy.  As long as the patient is tolerating the medication well without too much financial difficulty we can continue this therapy.  If there were to be any issues with the medicine we recommend she reach out to our clinic prior to her follow-up visit in 30-months.  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  Due to this I would recommend indefinite lifelong anticoagulation. --Continue Eliquis  2.5 mg twice daily for thromboprophylaxis. --Patient has completed 6 full months of full-strength anticoagulation therapy, she was agreeable to transition to maintenance Eliquis  2.5 mg twice daily --If the patient develops bleeding, intolerance,  or the medication is financially difficult to continue would recommend the patient reach out to our clinic for alternative options. --labs today show white blood cell 8.1, hemoglobin 13.5, MCV 96.1, platelets 228.  Creatinine LFTs within normal limits --We will have the patient return to clinic in 6 months time to assure continued tolerance of anticoagulation therapy.   # Liver Lesion # Weight Loss--improved  # Possible Metastatic Disease -- Noted on prior CT scan --I encourage patient to pursue an MRI liver in order to further assess.  She noted she does not wish to have this done as she does not want to subsequent workup to follow afterwards.  She notes that she is a DNR/DNI and does not wish to pursue this workup because she would not want to do anything with the results.  No orders of the defined types were placed in this encounter.  All questions were answered. The patient knows to call the clinic with any problems, questions or concerns.  A total of more than 25 minutes were spent on this encounter with face-to-face time and non-face-to-face time, including preparing to see the patient, ordering tests and/or medications, counseling the patient and coordination of care as outlined above.   Norleen IVAR Kidney, MD Department of Hematology/Oncology [facility] at [facility] Phone: [phone] Pager: [phone] Email: [email or web address]  10/23/2024 2:37 PM "

## 2024-11-04 ENCOUNTER — Other Ambulatory Visit: Payer: Self-pay | Admitting: Hematology and Oncology

## 2024-12-09 ENCOUNTER — Other Ambulatory Visit (HOSPITAL_COMMUNITY): Payer: Self-pay

## 2024-12-09 ENCOUNTER — Telehealth: Payer: Self-pay

## 2024-12-09 DIAGNOSIS — R2689 Other abnormalities of gait and mobility: Secondary | ICD-10-CM

## 2024-12-09 MED ORDER — LEVOTHYROXINE SODIUM 112 MCG PO TABS: 112.0000 ug | ORAL_TABLET | ORAL | 3 refills | Status: AC

## 2024-12-09 MED ORDER — ATORVASTATIN CALCIUM 20 MG PO TABS: 20.0000 mg | ORAL_TABLET | Freq: Every day | ORAL | 3 refills | Status: AC

## 2024-12-09 MED ORDER — CHOLESTYRAMINE 4 GM/DOSE PO POWD: ORAL | 0 refills | Status: AC

## 2024-12-09 MED ORDER — LOSARTAN POTASSIUM 50 MG PO TABS
100.0000 mg | ORAL_TABLET | Freq: Every day | ORAL | 3 refills | Status: AC
  Filled 2024-12-09: qty 180, 90d supply, fill #0

## 2024-12-09 MED ORDER — ATENOLOL 50 MG PO TABS: 50.0000 mg | ORAL_TABLET | Freq: Two times a day (BID) | ORAL | 3 refills | Status: AC

## 2024-12-10 ENCOUNTER — Other Ambulatory Visit (HOSPITAL_COMMUNITY): Payer: Self-pay

## 2024-12-10 ENCOUNTER — Telehealth: Payer: Self-pay | Admitting: *Deleted

## 2024-12-10 NOTE — Progress Notes (Signed)
 Complex Care Management Note  Care Guide Note 12/10/2024 Name: [name] MRN: [id number] DOB: 01-Jan-1951  Denise Weiss is a 74 y.o. year old female who sees Cleotilde Planas, MD for primary care. I reached out to Devere JULIANNA Birkenhead by phone today to offer complex care management services.  [name] was given information about Complex Care Management services today including:   The Complex Care Management services include support from the care team which includes your Nurse Care Manager, Clinical Social Worker, or Pharmacist.  The Complex Care Management team is here to help remove barriers to the health concerns and goals most important to you. Complex Care Management services are voluntary, and the patient may decline or stop services at any time by request to their care team member.   Complex Care Management Consent Status: Patient agreed to services and verbal consent obtained.   Follow up plan:  Telephone appointment with complex care management team member scheduled for:  12/25/24  Encounter Outcome:  Patient Scheduled  Harlene Satterfield  [facility]  [facility], [facility] Guide  Direct Dial: [phone]  Fax [phone]

## 2024-12-25 ENCOUNTER — Telehealth: Admitting: Licensed Clinical Social Worker

## 2025-04-26 ENCOUNTER — Inpatient Hospital Stay

## 2025-04-26 ENCOUNTER — Inpatient Hospital Stay: Admitting: Hematology and Oncology
